# Patient Record
Sex: Male | Born: 1985 | Race: Black or African American | Hispanic: No | Marital: Single | Smoking: Never smoker
Health system: Southern US, Community
[De-identification: ages and names within clinical notes are randomized; demographics above are authoritative.]

---

## 2010-09-21 ENCOUNTER — Emergency Department (HOSPITAL_COMMUNITY)
Admission: EM | Admit: 2010-09-21 | Discharge: 2010-09-21 | Disposition: A | Discharge status: Home / Self Care | Payer: Self-pay | Attending: Emergency Medicine | Admitting: Emergency Medicine

## 2010-09-21 DIAGNOSIS — K089 Disorder of teeth and supporting structures, unspecified: Secondary | ICD-10-CM | POA: Insufficient documentation

## 2010-11-02 ENCOUNTER — Emergency Department (HOSPITAL_COMMUNITY)
Admission: EM | Admit: 2010-11-02 | Discharge: 2010-11-02 | Disposition: A | Discharge status: Home / Self Care | Payer: Self-pay | Attending: Emergency Medicine | Admitting: Emergency Medicine

## 2010-11-02 DIAGNOSIS — K047 Periapical abscess without sinus: Secondary | ICD-10-CM | POA: Insufficient documentation

## 2011-01-18 ENCOUNTER — Emergency Department (HOSPITAL_COMMUNITY)
Admission: EM | Admit: 2011-01-18 | Discharge: 2011-01-18 | Disposition: A | Discharge status: Home / Self Care | Payer: Self-pay | Attending: Emergency Medicine | Admitting: Emergency Medicine

## 2011-01-18 DIAGNOSIS — K089 Disorder of teeth and supporting structures, unspecified: Secondary | ICD-10-CM | POA: Insufficient documentation

## 2011-01-18 DIAGNOSIS — K029 Dental caries, unspecified: Secondary | ICD-10-CM | POA: Insufficient documentation

## 2011-09-06 ENCOUNTER — Emergency Department (HOSPITAL_COMMUNITY)
Admission: EM | Admit: 2011-09-06 | Discharge: 2011-09-06 | Discharge status: Against Medical Advice | Payer: BC Managed Care – PPO | Attending: Emergency Medicine | Admitting: Emergency Medicine

## 2011-09-06 ENCOUNTER — Emergency Department (HOSPITAL_COMMUNITY)
Admission: EM | Admit: 2011-09-06 | Discharge: 2011-09-07 | Disposition: A | Discharge status: Home / Self Care | Payer: BC Managed Care – PPO | Attending: Emergency Medicine | Admitting: Emergency Medicine

## 2011-09-06 ENCOUNTER — Emergency Department (HOSPITAL_COMMUNITY): Payer: BC Managed Care – PPO

## 2011-09-06 ENCOUNTER — Encounter (HOSPITAL_COMMUNITY): Payer: Self-pay | Admitting: *Deleted

## 2011-09-06 DIAGNOSIS — M7989 Other specified soft tissue disorders: Secondary | ICD-10-CM | POA: Insufficient documentation

## 2011-09-06 DIAGNOSIS — T148XXA Other injury of unspecified body region, initial encounter: Secondary | ICD-10-CM

## 2011-09-06 DIAGNOSIS — IMO0002 Reserved for concepts with insufficient information to code with codable children: Secondary | ICD-10-CM | POA: Insufficient documentation

## 2011-09-06 DIAGNOSIS — M79609 Pain in unspecified limb: Secondary | ICD-10-CM | POA: Insufficient documentation

## 2011-09-06 DIAGNOSIS — Y92009 Unspecified place in unspecified non-institutional (private) residence as the place of occurrence of the external cause: Secondary | ICD-10-CM | POA: Insufficient documentation

## 2011-09-06 DIAGNOSIS — S60229A Contusion of unspecified hand, initial encounter: Secondary | ICD-10-CM | POA: Insufficient documentation

## 2011-09-06 NOTE — ED Notes (Signed)
Pt sustained injury to R hand, struck it on a wall while he was moving boxes. Presents w/ pain and swelling.

## 2011-09-07 NOTE — ED Notes (Signed)
Patient is alert and oriented x 4 with respirations even and unlabored.  NAD at this time.  Discharge instructions reviewed with patient and patient verbalized understanding.  Patient ambulated to lobby with steady gait and significant other to transport patient home.

## 2011-09-07 NOTE — Discharge Instructions (Signed)
Contusion A contusion is a deep bruise. Bruises happen when an injury causes bleeding under the skin. Signs of bruising include pain, puffiness (swelling), and discolored skin. The bruise may turn blue, purple, or yellow. HOME CARE   Rest the injured area until the pain and puffiness are better.   Try to limit use of the injured area as much as possible or as told by your doctor.   Put ice on the injured area.   Put ice in a plastic bag.   Place a towel between your skin and the bag.   Leave the ice on for 15 to 20 minutes, 3 to 4 times a day.   Raise (elevate) the injured area above the level of the heart.   Use an elastic bandage to lessen puffiness and motion.   Only take medicine as told by your doctor.   Eat healthy.   See your doctor for a follow-up visit.  GET HELP RIGHT AWAY IF:   There is more redness, puffiness, or pain.   You have a headache, muscle ache, or you feel dizzy and ill.   You have a fever.   The pain is not controlled with medicine.   The bruise is not getting better.   There is yellowish white fluid (pus) coming from the wound.   You lose feeling (numbness) in the injured area.   The bruised area feels cold.   There are new problems.  MAKE SURE YOU:   Understand these instructions.   Will watch your condition.   Will get help right away if you are not doing well or get worse.  Document Released: 12/25/2007 Document Revised: 03/20/2011 Document Reviewed: 12/25/2007 [employer] Patient Information 2012 [employer]. 

## 2011-09-07 NOTE — ED Provider Notes (Signed)
History     CSN: [id number]  Arrival date & time 09/06/11  2336   First MD Initiated Contact with Patient 09/07/11 0124      Chief Complaint  Patient presents with  . Hand Injury    (Consider location/radiation/quality/duration/timing/severity/associated sxs/prior treatment) Patient is a 26 y.o. male presenting with hand injury. The history is provided by the patient.  Hand Injury  The incident occurred 2 days ago. The incident occurred at home (he was moving furniture and his hand was smashed in the door). The injury mechanism was a direct blow and compression. The pain is present in the right hand. The quality of the pain is described as aching and throbbing. The pain is at a severity of 6/10. The pain is moderate. The pain has been constant since the incident. He reports no foreign bodies present. The symptoms are aggravated by movement, use and palpation. He has tried ice for the symptoms. The treatment provided mild relief.    History reviewed. No pertinent past medical history.  History reviewed. No pertinent past surgical history.  History reviewed. No pertinent family history.  History  Substance Use Topics  . Smoking status: Never Smoker   . Smokeless tobacco: Not on file  . Alcohol Use: Yes     occasionally      Review of Systems  All other systems reviewed and are negative.    Allergies  Review of patient's allergies indicates no known allergies.  Home Medications  No current outpatient prescriptions on file.  BP 141/73  Pulse 88  Temp(Src) 99 F (37.2 C) (Oral)  Resp 16  SpO2 99%  Physical Exam  Nursing note and vitals reviewed. Constitutional: He is oriented to person, place, and time. He appears well-developed and well-nourished. No distress.  HENT:  Head: Normocephalic and atraumatic.  Musculoskeletal: He exhibits tenderness. He exhibits no edema.       Right hand: He exhibits tenderness and swelling. He exhibits normal range of motion,  normal capillary refill and no deformity. normal sensation noted. Normal strength noted.       Hands: Neurological: He is alert and oriented to person, place, and time.  Skin: Skin is warm and dry. No erythema.  Psychiatric: He has a normal mood and affect. His behavior is normal.    ED Course  Procedures (including critical care time)  Labs Reviewed - No data to display Dg Hand Complete Right  09/07/2011  *RADIOLOGY REPORT*  Clinical Data: Injury to the right hand.  Pain in the fourth digit and metacarpal bones.  RIGHT HAND - COMPLETE 3+ VIEW  Comparison: None.  Findings: Three views of the right hand were obtained.  Normal alignment of the right hand.  No evidence for acute fracture or dislocation.  No gross soft tissue abnormality.  IMPRESSION: Negative radiographs of the right hand.  Original Report Authenticated By: Richarda Overlie, M.D.     No diagnosis found.    MDM   Patient with hand injury on Thursday afternoon with persistent swelling in his right hand and pain with bending his fingers. He is neurovascularly intact on exam and has full function of all tendons. Plain films negative.        Gwyneth Sprout, MD 09/07/11 [phone]

## 2012-05-25 ENCOUNTER — Emergency Department (HOSPITAL_COMMUNITY): Payer: BC Managed Care – PPO

## 2012-05-25 ENCOUNTER — Emergency Department (HOSPITAL_COMMUNITY)
Admission: EM | Admit: 2012-05-25 | Discharge: 2012-05-25 | Disposition: A | Discharge status: Home / Self Care | Payer: BC Managed Care – PPO | Attending: Emergency Medicine | Admitting: Emergency Medicine

## 2012-05-25 ENCOUNTER — Encounter (HOSPITAL_COMMUNITY): Payer: Self-pay | Admitting: Emergency Medicine

## 2012-05-25 DIAGNOSIS — S6990XA Unspecified injury of unspecified wrist, hand and finger(s), initial encounter: Secondary | ICD-10-CM | POA: Insufficient documentation

## 2012-05-25 DIAGNOSIS — S59909A Unspecified injury of unspecified elbow, initial encounter: Secondary | ICD-10-CM | POA: Insufficient documentation

## 2012-05-25 DIAGNOSIS — X503XXA Overexertion from repetitive movements, initial encounter: Secondary | ICD-10-CM | POA: Insufficient documentation

## 2012-05-25 DIAGNOSIS — Y9389 Activity, other specified: Secondary | ICD-10-CM | POA: Insufficient documentation

## 2012-05-25 DIAGNOSIS — M25539 Pain in unspecified wrist: Secondary | ICD-10-CM

## 2012-05-25 DIAGNOSIS — Y929 Unspecified place or not applicable: Secondary | ICD-10-CM | POA: Insufficient documentation

## 2012-05-25 DIAGNOSIS — M2550 Pain in unspecified joint: Secondary | ICD-10-CM | POA: Insufficient documentation

## 2012-05-25 MED ORDER — IBUPROFEN 600 MG PO TABS: 600.0000 mg | ORAL_TABLET | Freq: Four times a day (QID) | ORAL | Status: DC | PRN

## 2012-05-25 NOTE — ED Provider Notes (Signed)
History     CSN: [id number]  Arrival date & time 05/25/12  4098   First MD Initiated Contact with Patient 05/25/12 2137      Chief Complaint  Patient presents with  . Wrist Pain    (Consider location/radiation/quality/duration/timing/severity/associated sxs/prior treatment) Patient is a 26 y.o. male presenting with wrist pain. The history is provided by the patient and a friend. No language interpreter was used.  Wrist Pain This is a new problem. The current episode started in the past 7 days. The problem occurs daily. Associated symptoms include arthralgias. Pertinent negatives include no fever, nausea, vomiting or weakness. The symptoms are aggravated by bending. He has tried nothing for the symptoms.   26 year old complaining of left wrist pain after he hyperextended it picking up a box 7-8 days ago. Patient has good range of motion and 2+ radial pulse to the left wrist. No acute distress. He has taken nothing for pain.   History reviewed. No pertinent past medical history.  History reviewed. No pertinent past surgical history.  History reviewed. No pertinent family history.  History  Substance Use Topics  . Smoking status: Never Smoker   . Smokeless tobacco: Not on file  . Alcohol Use: Yes     Comment: occasionally      Review of Systems  Constitutional: Negative.  Negative for fever.  HENT: Negative.   Eyes: Negative.   Respiratory: Negative.   Cardiovascular: Negative.   Gastrointestinal: Negative.  Negative for nausea and vomiting.  Musculoskeletal: Positive for arthralgias.       Left wrist pain  Neurological: Negative.  Negative for weakness.  Psychiatric/Behavioral: Negative.   All other systems reviewed and are negative.    Allergies  Review of patient's allergies indicates no known allergies.  Home Medications  No current outpatient prescriptions on file.  BP 156/90  Pulse 77  Temp 98.9 F (37.2 C) (Oral)  Resp 16  SpO2 98%  Physical Exam    Nursing note and vitals reviewed. Constitutional: He is oriented to person, place, and time. He appears well-developed and well-nourished.  HENT:  Head: Normocephalic.  Eyes: Conjunctivae normal and EOM are normal. Pupils are equal, round, and reactive to light.  Neck: Normal range of motion. Neck supple.  Cardiovascular: Normal rate.   Pulmonary/Chest: Effort normal.  Abdominal: Soft.  Musculoskeletal: Normal range of motion. He exhibits tenderness.       Left wrist tenderness with hyperextension. Good range of motion 2+ radial pulse no acute distress.  Neurological: He is alert and oriented to person, place, and time.  Skin: Skin is warm and dry.  Psychiatric: He has a normal mood and affect.    ED Course  Procedures (including critical care time)  Labs Reviewed - No data to display Dg Wrist Complete Left  05/25/2012  *RADIOLOGY REPORT*  Clinical Data: Bent wrist backwards while lifting heavy box 1 week ago; left wrist pain, primarily at the anterior left wrist.  LEFT WRIST - COMPLETE 3+ VIEW  Comparison: None.  Findings: There is no evidence of fracture or dislocation.  The carpal rows are intact, and demonstrate normal alignment.  The joint spaces are preserved.  No significant soft tissue abnormalities are seen.  IMPRESSION: No evidence of fracture or dislocation.   Original Report Authenticated By: Tonia Ghent, M.D.      No diagnosis found.    MDM  Left wrist pain after hyperextending last week cutting the box. Wrist splint provided. Positive CMS below injury. Will followup with PCP  of choice. Take ibuprofen for pain every 6 hours and ice prn. Wrist x-ray was unremarkable and reviewed by myself.        Remi Haggard, NP 05/25/12 2224

## 2012-05-25 NOTE — ED Notes (Signed)
Pt states that he caught a box last week and hyperextended his L wrist. Pain went away but has now come back. No obvious deformity. Feels popping.

## 2012-05-25 NOTE — ED Provider Notes (Signed)
Medical screening examination/treatment/procedure(s) were performed by non-physician practitioner and as supervising physician I was immediately available for consultation/collaboration.   Gwyneth Sprout, MD 05/25/12 [phone]

## 2013-02-16 ENCOUNTER — Emergency Department (HOSPITAL_COMMUNITY)
Admission: EM | Admit: 2013-02-16 | Discharge: 2013-02-16 | Disposition: A | Discharge status: Home / Self Care | Payer: Self-pay | Attending: Emergency Medicine | Admitting: Emergency Medicine

## 2013-02-16 ENCOUNTER — Encounter (HOSPITAL_COMMUNITY): Payer: Self-pay | Admitting: Emergency Medicine

## 2013-02-16 ENCOUNTER — Emergency Department (HOSPITAL_COMMUNITY): Payer: Self-pay

## 2013-02-16 DIAGNOSIS — S6992XA Unspecified injury of left wrist, hand and finger(s), initial encounter: Secondary | ICD-10-CM

## 2013-02-16 DIAGNOSIS — Y990 Civilian activity done for income or pay: Secondary | ICD-10-CM | POA: Insufficient documentation

## 2013-02-16 DIAGNOSIS — S6990XA Unspecified injury of unspecified wrist, hand and finger(s), initial encounter: Secondary | ICD-10-CM | POA: Insufficient documentation

## 2013-02-16 DIAGNOSIS — W230XXA Caught, crushed, jammed, or pinched between moving objects, initial encounter: Secondary | ICD-10-CM | POA: Insufficient documentation

## 2013-02-16 DIAGNOSIS — S6980XA Other specified injuries of unspecified wrist, hand and finger(s), initial encounter: Secondary | ICD-10-CM | POA: Insufficient documentation

## 2013-02-16 DIAGNOSIS — Y939 Activity, unspecified: Secondary | ICD-10-CM | POA: Insufficient documentation

## 2013-02-16 DIAGNOSIS — Y929 Unspecified place or not applicable: Secondary | ICD-10-CM | POA: Insufficient documentation

## 2013-02-16 DIAGNOSIS — R209 Unspecified disturbances of skin sensation: Secondary | ICD-10-CM | POA: Insufficient documentation

## 2013-02-16 NOTE — ED Provider Notes (Signed)
CSN: [id number]     Arrival date & time 02/16/13  2143 History    This chart was scribed for Daniel Washington, non-physician practitioner working with Dione Booze, MD by Leone Payor, ED Scribe. This patient was seen in room [facility] and the patient's care was started at 2143.   First MD Initiated Contact with Patient 02/16/13 2245     Chief Complaint  Patient presents with  . Finger Injury    The history is provided by the patient. No language interpreter was used.    HPI Comments: Daniel Washington is a 27 y.o. male who presents to the Emergency Department complaining of a right middle finger injury that occurred 4 days ago. Pt states the tip of the finger was smashed under a metal piece on a conveyor belt at [employer]. He has associated numbness at the tip of the right middle finger and he noticed a clicking noise when he moves the joint.   History reviewed. No pertinent past medical history. History reviewed. No pertinent past surgical history. No family history on file. History  Substance Use Topics  . Smoking status: Never Smoker   . Smokeless tobacco: Not on file  . Alcohol Use: Yes     Comment: occasionally    Review of Systems  Musculoskeletal: Positive for arthralgias (right finger pain).  Neurological: Positive for numbness.  All other systems reviewed and are negative.    Allergies  Review of patient's allergies indicates no known allergies.  Home Medications  No current outpatient prescriptions on file. BP 148/87  Pulse 69  Temp(Src) 98.5 F (36.9 C) (Oral)  Resp 14  SpO2 96% Physical Exam  Nursing note and vitals reviewed. Constitutional: He appears well-developed and well-nourished. No distress.  HENT:  Head: Normocephalic and atraumatic.  Eyes: Pupils are equal, round, and reactive to light.  Neck: Normal range of motion. Neck supple.  Cardiovascular: Normal rate and regular rhythm.   Pulmonary/Chest: Effort normal.  Abdominal: Soft.   Musculoskeletal:       Left hand: He exhibits disruption of two-point discrimination. He exhibits normal range of motion, no tenderness, no bony tenderness, normal capillary refill, no deformity, no laceration and no swelling. Decreased sensation (finger tip does not feel dull sensations.) noted. Normal strength noted.       Hands: Neurological: He is alert.  Skin: Skin is warm and dry.    ED Course   Procedures (including critical care time)  DIAGNOSTIC STUDIES: Oxygen Saturation is 96% on RA, adequate by my interpretation.    COORDINATION OF CARE: 11:21 PM Discussed treatment plan with pt at bedside and pt agreed to plan.   Labs Reviewed - No data to display Dg Finger Middle Right  02/16/2013   *RADIOLOGY REPORT*  Clinical Data: Injury to right third finger.  RIGHT MIDDLE FINGER 2+V  Comparison: None.  Findings: There is no evidence of acute fracture, dislocation or soft tissue abnormality.  IMPRESSION: Normal right second finger.   Original Report Authenticated By: Irish Lack, M.D.   1. Finger injury, left, initial encounter     MDM  Finger splint applied Referral to Hand specialist  26 y.o.[name]'s evaluation in the Emergency Department is complete. It has been determined that no acute conditions requiring further emergency intervention are present at this time. The patient/guardian have been advised of the diagnosis and plan. We have discussed signs and symptoms that warrant return to the ED, such as changes or worsening in symptoms.  Vital signs are  stable at discharge. Filed Vitals:   02/16/13 2149  BP: 148/87  Pulse: 69  Temp: 98.5 F (36.9 C)  Resp: 14    Patient/guardian has voiced understanding and agreed to follow-up with the PCP or specialist.  I personally performed the services described in this documentation, which was scribed in my presence. The recorded information has been reviewed and is accurate.    Daniel Matas, PA-C 02/16/13  2327

## 2013-02-16 NOTE — ED Provider Notes (Signed)
Medical screening examination/treatment/procedure(s) were performed by non-physician practitioner and as supervising physician I was immediately available for consultation/collaboration.   Genessis Flanary, MD 02/16/13 2341 

## 2013-02-16 NOTE — ED Notes (Signed)
PT. REPORTS INJURY TO RIGHT DISTAL MIDDLE FINGER AT WORK ( [employer]) LAST Friday , PT. STATED NUMBNESS AT TIP OF RIGHT MIDDLE FINGER .

## 2013-02-16 NOTE — ED Notes (Signed)
Pt to room after xray

## 2014-08-03 ENCOUNTER — Encounter (HOSPITAL_COMMUNITY): Payer: Self-pay | Admitting: Emergency Medicine

## 2014-08-03 ENCOUNTER — Emergency Department (HOSPITAL_COMMUNITY)
Admission: EM | Admit: 2014-08-03 | Discharge: 2014-08-03 | Disposition: A | Discharge status: Home / Self Care | Payer: BLUE CROSS/BLUE SHIELD | Attending: Emergency Medicine | Admitting: Emergency Medicine

## 2014-08-03 DIAGNOSIS — K0889 Other specified disorders of teeth and supporting structures: Secondary | ICD-10-CM

## 2014-08-03 DIAGNOSIS — J029 Acute pharyngitis, unspecified: Secondary | ICD-10-CM | POA: Insufficient documentation

## 2014-08-03 DIAGNOSIS — K002 Abnormalities of size and form of teeth: Secondary | ICD-10-CM | POA: Insufficient documentation

## 2014-08-03 DIAGNOSIS — K0381 Cracked tooth: Secondary | ICD-10-CM | POA: Insufficient documentation

## 2014-08-03 DIAGNOSIS — K088 Other specified disorders of teeth and supporting structures: Secondary | ICD-10-CM | POA: Diagnosis present

## 2014-08-03 DIAGNOSIS — K029 Dental caries, unspecified: Secondary | ICD-10-CM | POA: Diagnosis not present

## 2014-08-03 LAB — RAPID STREP SCREEN (MED CTR MEBANE ONLY): STREPTOCOCCUS, GROUP A SCREEN (DIRECT): NEGATIVE

## 2014-08-03 MED ORDER — TRAMADOL HCL 50 MG PO TABS: 50.0000 mg | ORAL_TABLET | Freq: Four times a day (QID) | ORAL | Status: DC | PRN

## 2014-08-03 MED ORDER — PENICILLIN V POTASSIUM 500 MG PO TABS: 500.0000 mg | ORAL_TABLET | Freq: Four times a day (QID) | ORAL | Status: AC

## 2014-08-03 NOTE — ED Provider Notes (Signed)
CSN: [id number]     Arrival date & time 08/03/14  2044 History  This chart was scribed for non-physician practitioner, Antony Madura, PA-C,working with Mirian Mo, MD, by Karle Plumber, ED Scribe. This patient was seen in room WTR7/[facility] and the patient's care was started at 11:05 PM.  Chief Complaint  Patient presents with  . Sore Throat  . Dental Pain   Patient is a 29 y.o. male presenting with pharyngitis and tooth pain. The history is provided by the patient. No language interpreter was used.  Sore Throat  Dental Pain Associated symptoms: no congestion, no drooling and no fever     HPI Comments:  Daniel Washington is a 29 y.o. male who presents to the Emergency Department complaining of intermittent severe, aching, nonradiating right lower dental pain that began approximately two weeks ago. He reports associated constant sore throat that began approximately one week ago. He states he has taken Thera Flu and Tylenol with no relief of the pain. Chewing makes the tooth pain worse. Swallowing food makes the throat pain worse. Swallowing warm liquids helps alleviate the pain. Denies nasal congestion, rhinorrhea, fever, chills, drooling, inability to swallow, neck stiffness, nausea or vomiting. Pt has dentist appt in one week.  History reviewed. No pertinent past medical history. History reviewed. No pertinent past surgical history. History reviewed. No pertinent family history. History  Substance Use Topics  . Smoking status: Never Smoker   . Smokeless tobacco: Not on file  . Alcohol Use: No    Review of Systems  Constitutional: Negative for fever and chills.  HENT: Positive for dental problem and sore throat. Negative for congestion, drooling, rhinorrhea and trouble swallowing.   Gastrointestinal: Negative for nausea and vomiting.  Musculoskeletal: Negative for neck stiffness.  All other systems reviewed and are negative.   Allergies  Review of patient's allergies indicates no  known allergies.  Home Medications   Prior to Admission medications   Medication Sig Start Date End Date Taking? Authorizing Provider  penicillin v potassium (VEETID) 500 MG tablet Take 1 tablet (500 mg total) by mouth 4 (four) times daily. 08/03/14 08/10/14  Antony Madura, PA-C  traMADol (ULTRAM) 50 MG tablet Take 1 tablet (50 mg total) by mouth every 6 (six) hours as needed for severe pain. 08/03/14   Antony Madura, PA-C   Triage Vitals: BP 143/92 mmHg  Pulse 89  Temp(Src) 98.1 F (36.7 C) (Oral)  Resp 18  Ht  (1.676 m)  Wt 190 lb (86.183 kg)  BMI 30.68 kg/m2  SpO2 98%  Physical Exam  Constitutional: He is oriented to person, place, and time. He appears well-developed and well-nourished. No distress.  Nontoxic/nonseptic appearing  HENT:  Head: Normocephalic and atraumatic.  Right Ear: Tympanic membrane, external ear and ear canal normal. No mastoid tenderness.  Left Ear: Tympanic membrane, external ear and ear canal normal. No mastoid tenderness.  Nose: Nose normal.  Mouth/Throat: Uvula is midline and oropharynx is clear and moist. No oral lesions. No trismus in the jaw. Abnormal dentition. Dental caries present. No dental abscesses or uvula swelling.    Uvula midline. Patient tolerating secretions without difficulty. Patient has a cracked right lower second molar. No gingival swelling or fluctuance. No purulence or oral bleeding. No trismus. No posterior oropharyngeal erythema or exudates.  Eyes: Conjunctivae and EOM are normal. No scleral icterus.  Neck: Normal range of motion.  No nuchal rigidity or meningismus  Cardiovascular: Normal rate.   Pulmonary/Chest: Effort normal. No respiratory distress. He has  no wheezes.  Respirations even and unlabored  Musculoskeletal: Normal range of motion.  Neurological: He is alert and oriented to person, place, and time. He exhibits normal muscle tone. Coordination normal.  Skin: Skin is warm and dry. No rash noted. He is not diaphoretic.  No erythema. No pallor.  Psychiatric: He has a normal mood and affect. His behavior is normal.  Nursing note and vitals reviewed.   ED Course  Procedures (including critical care time) DIAGNOSTIC STUDIES: Oxygen Saturation is 98% on RA, normal by my interpretation.   COORDINATION OF CARE: 11:13 PM- Will prescribe antibiotic and pain medication and encouraged pt to keep dental appt next week. Pt verbalizes understanding and agrees to plan.  Medications - No data to display  Labs Review Labs Reviewed  RAPID STREP SCREEN  CULTURE, GROUP A STREP    Imaging Review No results found.   EKG Interpretation None      MDM   Final diagnoses:  Dentalgia  Pharyngitis    Patient with toothache. No gross abscess. Exam unconcerning for Ludwig's angina or spread of infection. Will treat with penicillin and pain medicine. Urged patient to follow-up with dentist. He states he has a dental visit scheduled for 1 week from today. Patient also complaining about sore throat x1 week. As patient is being started on penicillin for dentalgia, this will cover for strep; however, his symptoms today and physical exam are not concerning for strep pharyngitis. Uvula midline. Patient tolerating secretions without difficulty. No stridor or respiratory compromise. Saltwater gargles advised and return precautions provided. Patient agreeable to plan with no unaddressed concerns.  I personally performed the services described in this documentation, which was scribed in my presence. The recorded information has been reviewed and is accurate.   Filed Vitals:   08/03/14 2058 08/03/14 2321  BP: 143/92 146/94  Pulse: 89 89  Temp: 98.1 F (36.7 C)   TempSrc: Oral   Resp: 18   Height: 5\' 6"  (1.676 m)   Weight: 190 lb (86.183 kg)   SpO2: 98% 99%     Antony MaduraKelly Geni Skorupski, PA-C 08/04/14 0024  Mirian MoMatthew Gentry, MD 08/05/14 0145

## 2014-08-03 NOTE — ED Notes (Signed)
Pt is c/o severe sore throat  States it started about 3 weeks ago  Pt is also c/o toothache right bottom that started about a month and a half ago

## 2014-08-03 NOTE — Discharge Instructions (Signed)
Dental Pain A tooth ache may be caused by cavities (tooth decay). Cavities expose the nerve of the tooth to air and hot or cold temperatures. It may come from an infection or abscess (also called a boil or furuncle) around your tooth. It is also often caused by dental caries (tooth decay). This causes the pain you are having. DIAGNOSIS  Your caregiver can diagnose this problem by exam. TREATMENT   If caused by an infection, it may be treated with medications which kill germs (antibiotics) and pain medications as prescribed by your caregiver. Take medications as directed.  Only take over-the-counter or prescription medicines for pain, discomfort, or fever as directed by your caregiver.  Whether the tooth ache today is caused by infection or dental disease, you should see your dentist as soon as possible for further care. SEEK MEDICAL CARE IF: The exam and treatment you received today has been provided on an emergency basis only. This is not a substitute for complete medical or dental care. If your problem worsens or new problems (symptoms) appear, and you are unable to meet with your dentist, call or return to this location. SEEK IMMEDIATE MEDICAL CARE IF:   You have a fever.  You develop redness and swelling of your face, jaw, or neck.  You are unable to open your mouth.  You have severe pain uncontrolled by pain medicine. MAKE SURE YOU:   Understand these instructions.  Will watch your condition.  Will get help right away if you are not doing well or get worse. Document Released: 07/08/2005 Document Revised: 09/30/2011 Document Reviewed: 02/24/2008 [facility] Patient Information 2015 [place]. This information is not intended to replace advice given to you by your health care provider. Make sure you discuss any questions you have with your health care provider. Pharyngitis Pharyngitis is redness, pain, and swelling (inflammation) of your pharynx.  CAUSES  Pharyngitis is  usually caused by infection. Most of the time, these infections are from viruses (viral) and are part of a cold. However, sometimes pharyngitis is caused by bacteria (bacterial). Pharyngitis can also be caused by allergies. Viral pharyngitis may be spread from person to person by coughing, sneezing, and personal items or utensils (cups, forks, spoons, toothbrushes). Bacterial pharyngitis may be spread from person to person by more intimate contact, such as kissing.  SIGNS AND SYMPTOMS  Symptoms of pharyngitis include:   Sore throat.   Tiredness (fatigue).   Low-grade fever.   Headache.  Joint pain and muscle aches.  Skin rashes.  Swollen lymph nodes.  Plaque-like film on throat or tonsils (often seen with bacterial pharyngitis). DIAGNOSIS  Your health care provider will ask you questions about your illness and your symptoms. Your medical history, along with a physical exam, is often all that is needed to diagnose pharyngitis. Sometimes, a rapid strep test is done. Other lab tests may also be done, depending on the suspected cause.  TREATMENT  Viral pharyngitis will usually get better in 3-4 days without the use of medicine. Bacterial pharyngitis is treated with medicines that kill germs (antibiotics).  HOME CARE INSTRUCTIONS   Drink enough water and fluids to keep your urine clear or pale yellow.   Only take over-the-counter or prescription medicines as directed by your health care provider:   If you are prescribed antibiotics, make sure you finish them even if you start to feel better.   Do not take aspirin.   Get lots of rest.   Gargle with 8 oz of salt water (  tsp of salt per 1 qt of water) as often as every 1-2 hours to soothe your throat.   Throat lozenges (if you are not at risk for choking) or sprays may be used to soothe your throat. SEEK MEDICAL CARE IF:   You have large, tender lumps in your neck.  You have a rash.  You cough up green, yellow-brown, or  bloody spit. SEEK IMMEDIATE MEDICAL CARE IF:   Your neck becomes stiff.  You drool or are unable to swallow liquids.  You vomit or are unable to keep medicines or liquids down.  You have severe pain that does not go away with the use of recommended medicines.  You have trouble breathing (not caused by a stuffy nose). MAKE SURE YOU:   Understand these instructions.  Will watch your condition.  Will get help right away if you are not doing well or get worse. Document Released: 07/08/2005 Document Revised: 04/28/2013 Document Reviewed: 03/15/2013 [facility] Patient Information 2015 [place], [place]. This information is not intended to replace advice given to you by your health care provider. Make sure you discuss any questions you have with your health care provider. Salt Water Gargle This solution will help make your mouth and throat feel better. HOME CARE INSTRUCTIONS   Mix 1 teaspoon of salt in 8 ounces of warm water.  Gargle with this solution as much or often as you need or as directed. Swish and gargle gently if you have any sores or wounds in your mouth.  Do not swallow this mixture. Document Released: 04/11/2004 Document Revised: 09/30/2011 Document Reviewed: 09/02/2008 [facility] Patient Information 2015 [place], [place]. This information is not intended to replace advice given to you by your health care provider. Make sure you discuss any questions you have with your health care provider.

## 2014-08-06 LAB — CULTURE, GROUP A STREP

## 2014-08-29 ENCOUNTER — Emergency Department (HOSPITAL_COMMUNITY)
Admission: EM | Admit: 2014-08-29 | Discharge: 2014-08-29 | Disposition: A | Discharge status: Home / Self Care | Payer: BLUE CROSS/BLUE SHIELD | Attending: Emergency Medicine | Admitting: Emergency Medicine

## 2014-08-29 ENCOUNTER — Encounter (HOSPITAL_COMMUNITY): Payer: Self-pay | Admitting: Emergency Medicine

## 2014-08-29 DIAGNOSIS — S0181XA Laceration without foreign body of other part of head, initial encounter: Secondary | ICD-10-CM

## 2014-08-29 DIAGNOSIS — S01411A Laceration without foreign body of right cheek and temporomandibular area, initial encounter: Secondary | ICD-10-CM | POA: Insufficient documentation

## 2014-08-29 DIAGNOSIS — Y9389 Activity, other specified: Secondary | ICD-10-CM | POA: Diagnosis not present

## 2014-08-29 DIAGNOSIS — Y92410 Unspecified street and highway as the place of occurrence of the external cause: Secondary | ICD-10-CM | POA: Diagnosis not present

## 2014-08-29 DIAGNOSIS — Y998 Other external cause status: Secondary | ICD-10-CM | POA: Insufficient documentation

## 2014-08-29 MED ORDER — TETANUS-DIPHTH-ACELL PERTUSSIS 5-2.5-18.5 LF-MCG/0.5 IM SUSP
0.5000 mL | Freq: Once | INTRAMUSCULAR | Status: AC
  Administered 2014-08-29: 0.5 mL via INTRAMUSCULAR
  Filled 2014-08-29: qty 0.5

## 2014-08-29 MED ORDER — LIDOCAINE HCL (PF) 1 % IJ SOLN
5.0000 mL | Freq: Once | INTRAMUSCULAR | Status: AC
  Administered 2014-08-29: 5 mL via INTRADERMAL
  Filled 2014-08-29: qty 5

## 2014-08-29 MED ORDER — LIDOCAINE-EPINEPHRINE 1 %-1:100000 IJ SOLN
10.0000 mL | Freq: Once | INTRAMUSCULAR | Status: AC
  Administered 2014-08-29: 10 mL
  Filled 2014-08-29: qty 1
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
## 2014-08-29 NOTE — Discharge Instructions (Signed)
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
## 2019-01-13 ENCOUNTER — Emergency Department (HOSPITAL_COMMUNITY)
Admission: EM | Admit: 2019-01-13 | Discharge: 2019-01-13 | Discharge status: Absent without leave | Payer: BLUE CROSS/BLUE SHIELD

## 2019-04-22 ENCOUNTER — Other Ambulatory Visit: Payer: Self-pay

## 2019-04-22 DIAGNOSIS — W208XXA Other cause of strike by thrown, projected or falling object, initial encounter: Secondary | ICD-10-CM | POA: Insufficient documentation

## 2019-04-22 DIAGNOSIS — Y939 Activity, unspecified: Secondary | ICD-10-CM | POA: Diagnosis not present

## 2019-04-22 DIAGNOSIS — S99912A Unspecified injury of left ankle, initial encounter: Secondary | ICD-10-CM | POA: Diagnosis present

## 2019-04-22 DIAGNOSIS — S8262XA Displaced fracture of lateral malleolus of left fibula, initial encounter for closed fracture: Secondary | ICD-10-CM | POA: Diagnosis not present

## 2019-04-22 DIAGNOSIS — Y929 Unspecified place or not applicable: Secondary | ICD-10-CM | POA: Insufficient documentation

## 2019-04-22 DIAGNOSIS — Y990 Civilian activity done for income or pay: Secondary | ICD-10-CM | POA: Diagnosis not present

## 2019-04-22 NOTE — ED Triage Notes (Signed)
Pt c/o L leg injury due to pallets falling onto his leg while at work.

## 2019-04-23 ENCOUNTER — Other Ambulatory Visit: Payer: Self-pay

## 2019-04-23 ENCOUNTER — Encounter (HOSPITAL_BASED_OUTPATIENT_CLINIC_OR_DEPARTMENT_OTHER): Payer: Self-pay

## 2019-04-23 ENCOUNTER — Emergency Department (HOSPITAL_BASED_OUTPATIENT_CLINIC_OR_DEPARTMENT_OTHER)
Admission: EM | Admit: 2019-04-23 | Discharge: 2019-04-23 | Disposition: A | Discharge status: Home / Self Care | Payer: Worker's Compensation | Attending: Emergency Medicine | Admitting: Emergency Medicine

## 2019-04-23 ENCOUNTER — Emergency Department (HOSPITAL_BASED_OUTPATIENT_CLINIC_OR_DEPARTMENT_OTHER): Payer: BLUE CROSS/BLUE SHIELD | Attending: Emergency Medicine

## 2019-04-23 DIAGNOSIS — S82392A Other fracture of lower end of left tibia, initial encounter for closed fracture: Secondary | ICD-10-CM

## 2019-04-23 MED ORDER — OXYCODONE-ACETAMINOPHEN 5-325 MG PO TABS: 1.0000 | ORAL_TABLET | Freq: Four times a day (QID) | ORAL | 0 refills | Status: DC | PRN

## 2019-04-23 MED ORDER — OXYCODONE-ACETAMINOPHEN 5-325 MG PO TABS
1.0000 | ORAL_TABLET | ORAL | Status: AC | PRN
  Administered 2019-04-23 (×2): 1 via ORAL
  Filled 2019-04-23 (×2): qty 1

## 2019-04-23 NOTE — ED Provider Notes (Signed)
[facility] Provider Note: Georgena Spurling, MD, FACEP  CSN: [id number] MRN: [id number] ARRIVAL: 04/22/19 at [facility]: [place]  Leg Injury   HISTORY OF PRESENT ILLNESS  04/23/19 1:40 AM Daniel Washington is a 33 y.o. male who had several pallets fall onto his left leg at work just prior to arrival.  He is having pain in his left ankle radiating occipitally.  He rates the pain as a 10 out of 10, worse with movement or palpation.  He is unable to bear weight on the left foot.  He denies other injury.   History reviewed. No pertinent past medical history.  History reviewed. No pertinent surgical history.  No family history on file.  Social History   Tobacco Use   Smoking status: Never Smoker  Substance Use Topics   Alcohol use: Yes    Comment: occ   Drug use: No    Prior to Admission medications   Medication Sig Start Date End Date Taking? Authorizing Provider  traMADol (ULTRAM) 50 MG tablet Take 1 tablet (50 mg total) by mouth every 6 (six) hours as needed for severe pain. 08/03/14   Antonietta Breach, PA-C    Allergies Patient has no known allergies.   REVIEW OF SYSTEMS  Negative except as noted here or in the History of Present Illness.   PHYSICAL EXAMINATION  Initial Vital Signs Blood pressure (!) 138/98, pulse 99, temperature 99.5 F (37.5 C), temperature source Oral, resp. rate 20, height 5\' 6"  (1.676 m), weight 104.3 kg, SpO2 98 %.  Examination General: Well-developed, well-nourished male in no acute distress; appearance consistent with age of record HENT: normocephalic; atraumatic Eyes: Normal appearance Neck: supple Heart: regular rate and rhythm Lungs: clear to auscultation bilaterally Abdomen: soft; nondistended; nontender; bowel sounds present Extremities: No deformity; no tenderness, swelling, ecchymosis or instability of left knee; left calf compartments soft and nontender; tenderness of left ankle with decreased range  of motion, left foot distally neurovascularly intact with intact tendon function Neurologic: Awake, alert and oriented; motor function intact in all extremities and symmetric; no facial droop Skin: Warm and dry Psychiatric: Normal mood and affect   RESULTS  Summary of this visit's results, reviewed by myself:   EKG Interpretation  Date/Time:    Ventricular Rate:    PR Interval:    QRS Duration:   QT Interval:    QTC Calculation:   R Axis:     Text Interpretation:        Laboratory Studies: No results found for this or any previous visit (from the past 24 hour(s)). Imaging Studies: Dg Tibia/fibula Left  Result Date: 04/23/2019 CLINICAL DATA:  Pallet fell on left leg while at work EXAM: LEFT TIBIA AND FIBULA - 2 VIEW; LEFT ANKLE COMPLETE - 3+ VIEW COMPARISON:  Same day knee radiographs FINDINGS: There is a minimally displaced posterior malleolar fracture involving the distal tibia with intra-articular extension. Circumferential ankle swelling and a moderate left ankle effusion is present. No additional fracture or traumatic malalignment is seen. The ankle mortise remains congruent. Corticated os peroneum is noted. Bidirectional calcaneal spurs are present. Knee radiographs evaluate the proximal tibia and fibula. No proximal tibia or fibular fractures are identified. IMPRESSION: 1. Minimally displaced posterior malleolar fracture with intra-articular extension. 2. Moderate left ankle effusion and soft tissue swelling. Electronically Signed   By: Lovena Le M.D.   On: 04/23/2019 00:55   Dg Ankle Complete Left  Result Date: 04/23/2019 CLINICAL DATA:  Pallet  fell on left leg while at work EXAM: LEFT TIBIA AND FIBULA - 2 VIEW; LEFT ANKLE COMPLETE - 3+ VIEW COMPARISON:  Same day knee radiographs FINDINGS: There is a minimally displaced posterior malleolar fracture involving the distal tibia with intra-articular extension. Circumferential ankle swelling and a moderate left ankle effusion is  present. No additional fracture or traumatic malalignment is seen. The ankle mortise remains congruent. Corticated os peroneum is noted. Bidirectional calcaneal spurs are present. Knee radiographs evaluate the proximal tibia and fibula. No proximal tibia or fibular fractures are identified. IMPRESSION: 1. Minimally displaced posterior malleolar fracture with intra-articular extension. 2. Moderate left ankle effusion and soft tissue swelling. Electronically Signed   By: Kreg Shropshire M.D.   On: 04/23/2019 00:55   Dg Knee Complete 4 Views Left  Result Date: 04/23/2019 CLINICAL DATA:  Pallet fell on leg, while at work pain radiating from the ankle EXAM: LEFT KNEE - COMPLETE 4+ VIEW COMPARISON:  None. FINDINGS: Small suprapatellar effusion. Minimal swelling. No acute fracture or traumatic malalignment. Normal bone mineralization. No suspicious osseous lesions. No significant arthrosis or joint space narrowing. IMPRESSION: Small knee effusion and mild swelling. No acute osseous abnormality. Electronically Signed   By: Kreg Shropshire M.D.   On: 04/23/2019 00:52    ED COURSE and MDM  Nursing notes and initial vitals signs, including pulse oximetry, reviewed.  Vitals:   04/23/19 0006 04/23/19 0008  BP: (!) 138/98   Pulse: 99   Resp: 20   Temp: 99.5 F (37.5 C)   TempSrc: Oral   SpO2: 98%   Weight:  104.3 kg  Height:  5\' 6"  (1.676 m)   We will place the patient's left leg in a short posterior splint and provide crutches keeping him nonweightbearing.  He was advised the fracture may require surgical fixation.  Although swelling and an effusion are seen on the knee radiograph his knee on examination is unremarkable.  PROCEDURES    ED DIAGNOSES     ICD-10-CM   1. Fracture, posterior malleolus, left, closed, initial encounter  S82.392A        Lakyla Biswas, MD 04/23/19 0200

## 2019-05-13 ENCOUNTER — Other Ambulatory Visit: Payer: Self-pay | Admitting: Orthopaedic Surgery

## 2019-05-20 NOTE — Progress Notes (Signed)
Patient denies shortness of breath, fever, cough and chest pain.  PCP - Denies    Cardiologist - Denies  Chest x-ray - Denies EKG - Denies Stress Test - Denies ECHO - Denies Cardiac Cath - Denies  ERAS: Clears til 7:30 am DOS, Ensure drink given.  Anesthesia review: no  STOP now taking any Aspirin (unless otherwise instructed by your surgeon), Aleve, Naproxen, Ibuprofen, Motrin, Advil, Goody's, BC's, all herbal medications, fish oil, and all vitamins.   Coronavirus Screening Have you experienced the following symptoms:  Cough yes/no: No Fever (>100.64F)  yes/no: No Runny nose yes/no: No Sore throat yes/no: No Difficulty breathing/shortness of breath  yes/no: No  Have you traveled in the last 14 days and where? yes/no: No  Patient verbalized understanding of instructions that were given to them at the PAT appointment.

## 2019-05-20 NOTE — Progress Notes (Signed)
[id number], [place] - [place] [address] Phone: [phone] Fax: [id number] - [place], [place] - [address] AT [place] [address] [place] [address] Phone: [phone] Fax: [phone]      Your procedure is scheduled on Tuesday, 11/3.  Report to [facility]tr[facility] "A" at 8:30 am A.M., and check in at the Admitting office.  Call this number if you have problems the morning of surgery:  [phone]  Call [phone] if you have any questions prior to your surgery date Monday-Friday 8am-4pm    Remember:  Do not eat after midnight the night before your surgery -Monday  You may drink clear liquids until 7:30 am the morning of your surgery.    Clear liquids allowed are: Water, Non-Citrus Juices (without pulp), Carbonated Beverages, Clear Tea, Black Coffee Only, and Gatorade   Please complete your PRE-SURGERY ENSURE that was provided to you by 7:30 am,  the morning of surgery.  Please, if able, drink it in one setting. DO NOT SIP.   Take these medicines the morning of surgery with A SIP OF WATER: oxyCODONE-acetaminophen (PERCOCET)  If needed   STOP now taking any Aspirin (unless otherwise instructed by your surgeon), Aleve, Naproxen, Ibuprofen, Motrin, Advil, Goody's, BC's, all herbal medications, fish oil, and all vitamins.    The Morning of Surgery  Do not wear jewelry.  Do not wear lotions,powders, colognes or deodorant  Do not shave 48 hours prior to surgery.  Men may shave face and neck.  Do not bring valuables to the hospital.  [facility] is not responsible for any belongings or valuables.  If you are a smoker, DO NOT Smoke 24 hours prior to surgery  IF you wear a CPAP at night please bring your mask, tubing, and machine the morning of surgery   Remember that you must have someone to transport you home after  your surgery, and remain with you for 24 hours if you are discharged the same day.  Patients discharged the day of surgery will not be allowed to drive home.    Contacts, glasses, hearing aids, dentures or bridgework may not be worn into surgery.   Leave your suitcase in the car.  After surgery it may be brought to your room.  For patients admitted to the hospital, discharge time will be determined by your treatment team.   Special instructions:   [facility]- Preparing For Surgery  Before surgery, you can play an important role. Because skin is not sterile, your skin needs to be as free of germs as possible. You can reduce the number of germs on your skin by washing with CHG (chlorahexidine gluconate) Soap before surgery.  CHG is an antiseptic cleaner which kills germs and bonds with the skin to continue killing germs even after washing.    Oral Hygiene is also important to reduce your risk of infection.  Remember - BRUSH YOUR TEETH THE MORNING OF SURGERY WITH YOUR REGULAR TOOTHPASTE  Please do not use if you have an allergy to CHG or antibacterial soaps. If your skin becomes reddened/irritated stop using the CHG.  Do not shave (including legs and underarms) for at least 48 hours prior to first CHG shower. It is OK to shave your face.  Please follow these instructions carefully.   1. Shower the [facility] the [facility]  OF SURGERY Tues with CHG Soap.   2. If you chose to wash your hair, wash your hair first as usual with your normal shampoo.  3. After you shampoo, rinse your hair and body thoroughly to remove the shampoo.  4. Use CHG as you would any other liquid soap. You can apply CHG directly to the skin and wash gently with a scrungie or a clean washcloth.   5. Apply the CHG Soap to your body ONLY FROM THE NECK DOWN.  Do not use on open wounds or open sores. Avoid contact with your eyes, ears, mouth and genitals (private parts). Wash Face and genitals (private parts)   with your normal soap.   6. Wash thoroughly, paying special attention to the area where your surgery will be performed.  7. Thoroughly rinse your body with warm water from the neck down.  8. DO NOT shower/wash with your normal soap after using and rinsing off the CHG Soap.  9. Pat yourself dry with a CLEAN TOWEL.  10. Wear CLEAN PAJAMAS to bed the night before surgery, wear comfortable clothes the morning of surgery  11. Place CLEAN SHEETS on your bed the night of your first shower and DO NOT SLEEP WITH PETS.    Day of Surgery:  Do not apply any deodorants/lotions. Please shower the morning of surgery with the CHG soap  Please wear clean clothes to the hospital/surgery center.   Remember to brush your teeth WITH YOUR REGULAR TOOTHPASTE.

## 2019-05-21 ENCOUNTER — Other Ambulatory Visit (HOSPITAL_COMMUNITY)
Admission: RE | Admit: 2019-05-21 | Discharge: 2019-05-21 | Disposition: A | Discharge status: Home / Self Care | Payer: HRSA Program | Source: Ambulatory Visit | Attending: Orthopaedic Surgery | Admitting: Orthopaedic Surgery

## 2019-05-21 ENCOUNTER — Other Ambulatory Visit: Payer: Self-pay

## 2019-05-21 ENCOUNTER — Encounter (HOSPITAL_COMMUNITY): Payer: Self-pay

## 2019-05-21 ENCOUNTER — Encounter (HOSPITAL_COMMUNITY)
Admission: RE | Admit: 2019-05-21 | Discharge: 2019-05-21 | Disposition: A | Discharge status: Home / Self Care | Payer: Worker's Compensation | Source: Ambulatory Visit | Attending: Orthopaedic Surgery | Admitting: Orthopaedic Surgery

## 2019-05-21 DIAGNOSIS — Z01812 Encounter for preprocedural laboratory examination: Secondary | ICD-10-CM | POA: Insufficient documentation

## 2019-05-21 DIAGNOSIS — Z20828 Contact with and (suspected) exposure to other viral communicable diseases: Secondary | ICD-10-CM | POA: Insufficient documentation

## 2019-05-21 LAB — CBC
HCT: 43.3 % (ref 39.0–52.0)
Hemoglobin: 15.1 g/dL (ref 13.0–17.0)
MCH: 31.7 pg (ref 26.0–34.0)
MCHC: 34.9 g/dL (ref 30.0–36.0)
MCV: 91 fL (ref 80.0–100.0)
Platelets: 275 10*3/uL (ref 150–400)
RBC: 4.76 MIL/uL (ref 4.22–5.81)
RDW: 12.4 % (ref 11.5–15.5)
WBC: 8.7 10*3/uL (ref 4.0–10.5)
nRBC: 0 % (ref 0.0–0.2)

## 2019-05-21 LAB — SURGICAL PCR SCREEN
MRSA, PCR: NEGATIVE
Staphylococcus aureus: NEGATIVE

## 2019-05-22 LAB — NOVEL CORONAVIRUS, NAA (HOSP ORDER, SEND-OUT TO REF LAB; TAT 18-24 HRS): SARS-CoV-2, NAA: NOT DETECTED

## 2019-05-25 ENCOUNTER — Ambulatory Visit (HOSPITAL_COMMUNITY): Payer: Worker's Compensation | Admitting: Certified Registered Nurse Anesthetist

## 2019-05-25 ENCOUNTER — Encounter (HOSPITAL_COMMUNITY): Payer: Self-pay | Admitting: Certified Registered Nurse Anesthetist

## 2019-05-25 ENCOUNTER — Ambulatory Visit (HOSPITAL_COMMUNITY): Payer: BLUE CROSS/BLUE SHIELD | Attending: Orthopaedic Surgery

## 2019-05-25 ENCOUNTER — Ambulatory Visit (HOSPITAL_COMMUNITY)
Admission: RE | Admit: 2019-05-25 | Discharge: 2019-05-25 | Disposition: A | Discharge status: Home / Self Care | Payer: Worker's Compensation | Attending: Orthopaedic Surgery | Admitting: Orthopaedic Surgery

## 2019-05-25 ENCOUNTER — Encounter (HOSPITAL_COMMUNITY)
Admission: RE | Disposition: A | Discharge status: Home / Self Care | Payer: Self-pay | Source: Home / Self Care | Attending: Orthopaedic Surgery

## 2019-05-25 ENCOUNTER — Other Ambulatory Visit: Payer: Self-pay

## 2019-05-25 DIAGNOSIS — X500XXA Overexertion from strenuous movement or load, initial encounter: Secondary | ICD-10-CM | POA: Insufficient documentation

## 2019-05-25 DIAGNOSIS — Z79899 Other long term (current) drug therapy: Secondary | ICD-10-CM | POA: Diagnosis not present

## 2019-05-25 DIAGNOSIS — E669 Obesity, unspecified: Secondary | ICD-10-CM | POA: Diagnosis not present

## 2019-05-25 DIAGNOSIS — T1490XA Injury, unspecified, initial encounter: Secondary | ICD-10-CM | POA: Diagnosis present

## 2019-05-25 DIAGNOSIS — S82872A Displaced pilon fracture of left tibia, initial encounter for closed fracture: Secondary | ICD-10-CM | POA: Insufficient documentation

## 2019-05-25 DIAGNOSIS — Z6835 Body mass index (BMI) 35.0-35.9, adult: Secondary | ICD-10-CM | POA: Insufficient documentation

## 2019-05-25 DIAGNOSIS — S82392A Other fracture of lower end of left tibia, initial encounter for closed fracture: Secondary | ICD-10-CM | POA: Diagnosis present

## 2019-05-25 HISTORY — PX: SYNDESMOSIS REPAIR: SHX5182

## 2019-05-25 HISTORY — PX: ORIF ANKLE FRACTURE: SHX5408

## 2019-05-25 SURGERY — OPEN REDUCTION INTERNAL FIXATION (ORIF) ANKLE FRACTURE
Anesthesia: Regional | Site: Ankle | Laterality: Left

## 2019-05-25 MED ORDER — PHENYLEPHRINE HCL-NACL 10-0.9 MG/250ML-% IV SOLN
INTRAVENOUS | Status: DC | PRN
  Administered 2019-05-25: 20 ug/min via INTRAVENOUS

## 2019-05-25 MED ORDER — MIDAZOLAM HCL 2 MG/2ML IJ SOLN
INTRAMUSCULAR | Status: AC
  Administered 2019-05-25: 2 mg via INTRAVENOUS
  Filled 2019-05-25: qty 2

## 2019-05-25 MED ORDER — FENTANYL CITRATE (PF) 100 MCG/2ML IJ SOLN
INTRAMUSCULAR | Status: DC | PRN
  Administered 2019-05-25: 100 ug via INTRAVENOUS
  Administered 2019-05-25: 50 ug via INTRAVENOUS
  Administered 2019-05-25: 100 ug via INTRAVENOUS

## 2019-05-25 MED ORDER — OXYCODONE HCL 5 MG PO TABS: 5.0000 mg | ORAL_TABLET | ORAL | 0 refills | Status: AC | PRN

## 2019-05-25 MED ORDER — PROPOFOL 10 MG/ML IV BOLUS
INTRAVENOUS | Status: DC | PRN
  Administered 2019-05-25: 200 mg via INTRAVENOUS

## 2019-05-25 MED ORDER — ESMOLOL HCL 100 MG/10ML IV SOLN
INTRAVENOUS | Status: AC
  Filled 2019-05-25: qty 10

## 2019-05-25 MED ORDER — MIDAZOLAM HCL 2 MG/2ML IJ SOLN
2.0000 mg | Freq: Once | INTRAMUSCULAR | Status: AC
  Administered 2019-05-25: 09:00:00 2 mg via INTRAVENOUS

## 2019-05-25 MED ORDER — ROPIVACAINE HCL 5 MG/ML IJ SOLN
INTRAMUSCULAR | Status: DC | PRN
  Administered 2019-05-25: 30 mL via PERINEURAL

## 2019-05-25 MED ORDER — PHENYLEPHRINE HCL (PRESSORS) 10 MG/ML IV SOLN
INTRAVENOUS | Status: DC | PRN
  Administered 2019-05-25: 120 ug via INTRAVENOUS
  Administered 2019-05-25: 80 ug via INTRAVENOUS
  Administered 2019-05-25: 120 ug via INTRAVENOUS

## 2019-05-25 MED ORDER — FENTANYL CITRATE (PF) 100 MCG/2ML IJ SOLN
INTRAMUSCULAR | Status: AC
  Administered 2019-05-25: 100 ug via INTRAVENOUS
  Filled 2019-05-25: qty 2

## 2019-05-25 MED ORDER — LIDOCAINE HCL (PF) 1 % IJ SOLN
INTRAMUSCULAR | Status: DC | PRN
  Administered 2019-05-25: 10 mL

## 2019-05-25 MED ORDER — MIDAZOLAM HCL 2 MG/2ML IJ SOLN
INTRAMUSCULAR | Status: AC
  Filled 2019-05-25: qty 2

## 2019-05-25 MED ORDER — DEXAMETHASONE SODIUM PHOSPHATE 10 MG/ML IJ SOLN
INTRAMUSCULAR | Status: DC | PRN
  Administered 2019-05-25: 5 mg via INTRAVENOUS
  Administered 2019-05-25: 10 mg

## 2019-05-25 MED ORDER — ONDANSETRON HCL 4 MG/2ML IJ SOLN
INTRAMUSCULAR | Status: DC | PRN
  Administered 2019-05-25: 4 mg via INTRAVENOUS

## 2019-05-25 MED ORDER — 0.9 % SODIUM CHLORIDE (POUR BTL) OPTIME
TOPICAL | Status: DC | PRN
  Administered 2019-05-25: 10:00:00 1000 mL

## 2019-05-25 MED ORDER — SUGAMMADEX SODIUM 200 MG/2ML IV SOLN
INTRAVENOUS | Status: DC | PRN
  Administered 2019-05-25: 400 mg via INTRAVENOUS

## 2019-05-25 MED ORDER — CEFAZOLIN SODIUM-DEXTROSE 2-4 GM/100ML-% IV SOLN
2.0000 g | INTRAVENOUS | Status: AC
  Administered 2019-05-25: 2 g via INTRAVENOUS
  Filled 2019-05-25: qty 100

## 2019-05-25 MED ORDER — SUCCINYLCHOLINE CHLORIDE 200 MG/10ML IV SOSY
PREFILLED_SYRINGE | INTRAVENOUS | Status: AC
  Filled 2019-05-25: qty 10

## 2019-05-25 MED ORDER — FENTANYL CITRATE (PF) 100 MCG/2ML IJ SOLN
100.0000 ug | Freq: Once | INTRAMUSCULAR | Status: AC
  Administered 2019-05-25: 09:00:00 100 ug via INTRAVENOUS

## 2019-05-25 MED ORDER — MIDAZOLAM HCL 5 MG/5ML IJ SOLN
INTRAMUSCULAR | Status: DC | PRN
  Administered 2019-05-25: 2 mg via INTRAVENOUS

## 2019-05-25 MED ORDER — LACTATED RINGERS IV SOLN
INTRAVENOUS | Status: DC
  Administered 2019-05-25 (×2): via INTRAVENOUS

## 2019-05-25 MED ORDER — POVIDONE-IODINE 10 % EX SWAB
2.0000 "application " | Freq: Once | CUTANEOUS | Status: AC
  Administered 2019-05-25: 2 via TOPICAL

## 2019-05-25 MED ORDER — ROCURONIUM BROMIDE 100 MG/10ML IV SOLN
INTRAVENOUS | Status: DC | PRN
  Administered 2019-05-25: 80 mg via INTRAVENOUS
  Administered 2019-05-25: 20 mg via INTRAVENOUS

## 2019-05-25 MED ORDER — SUCCINYLCHOLINE CHLORIDE 20 MG/ML IJ SOLN
INTRAMUSCULAR | Status: DC | PRN
  Administered 2019-05-25: 100 mg via INTRAVENOUS

## 2019-05-25 MED ORDER — LIDOCAINE HCL (CARDIAC) PF 100 MG/5ML IV SOSY
PREFILLED_SYRINGE | INTRAVENOUS | Status: DC | PRN
  Administered 2019-05-25: 40 mg via INTRAVENOUS

## 2019-05-25 MED ORDER — PROPOFOL 10 MG/ML IV BOLUS
INTRAVENOUS | Status: AC
  Filled 2019-05-25: qty 20

## 2019-05-25 MED ORDER — FENTANYL CITRATE (PF) 250 MCG/5ML IJ SOLN
INTRAMUSCULAR | Status: AC
  Filled 2019-05-25: qty 5

## 2019-05-25 MED ORDER — LIDOCAINE 2% (20 MG/ML) 5 ML SYRINGE
INTRAMUSCULAR | Status: AC
  Filled 2019-05-25: qty 5

## 2019-05-25 SURGICAL SUPPLY — 64 items
ALCOHOL 70% 16 OZ (MISCELLANEOUS) ×3 IMPLANT
BANDAGE ESMARK 6X9 LF (GAUZE/BANDAGES/DRESSINGS) IMPLANT
BIT DRILL 2 LNG CALIBR (DRILL) ×3 IMPLANT
BIT DRILL 2.5 CANN STRL (BIT) ×3 IMPLANT
BLADE SURG 15 STRL LF DISP TIS (BLADE) ×1 IMPLANT
BLADE SURG 15 STRL SS (BLADE) ×2
BNDG COHESIVE 4X5 TAN STRL (GAUZE/BANDAGES/DRESSINGS) IMPLANT
BNDG COHESIVE 6X5 TAN STRL LF (GAUZE/BANDAGES/DRESSINGS) IMPLANT
BNDG ELASTIC 6X10 VLCR STRL LF (GAUZE/BANDAGES/DRESSINGS) ×3 IMPLANT
BNDG ESMARK 6X9 LF (GAUZE/BANDAGES/DRESSINGS)
CANISTER SUCT 3000ML PPV (MISCELLANEOUS) ×3 IMPLANT
CHLORAPREP W/TINT 26 (MISCELLANEOUS) ×6 IMPLANT
COVER SURGICAL LIGHT HANDLE (MISCELLANEOUS) ×3 IMPLANT
COVER WAND RF STERILE (DRAPES) ×3 IMPLANT
CUFF TOURN SGL QUICK 34 (TOURNIQUET CUFF) ×2
CUFF TOURN SGL QUICK 42 (TOURNIQUET CUFF) IMPLANT
CUFF TRNQT CYL 34X4.125X (TOURNIQUET CUFF) ×1 IMPLANT
DRAPE C-ARM 42X72 X-RAY (DRAPES) ×3 IMPLANT
DRAPE C-ARMOR (DRAPES) ×3 IMPLANT
DRAPE OEC MINIVIEW 54X84 (DRAPES) ×3 IMPLANT
DRAPE U-SHAPE 47X51 STRL (DRAPES) ×3 IMPLANT
DRSG MEPITEL 4X7.2 (GAUZE/BANDAGES/DRESSINGS) ×3 IMPLANT
DRSG PAD ABDOMINAL 8X10 ST (GAUZE/BANDAGES/DRESSINGS) ×3 IMPLANT
DRSG XEROFORM 1X8 (GAUZE/BANDAGES/DRESSINGS) ×3 IMPLANT
ELECT REM PT RETURN 9FT ADLT (ELECTROSURGICAL) ×3
ELECTRODE REM PT RTRN 9FT ADLT (ELECTROSURGICAL) ×1 IMPLANT
GAUZE SPONGE 4X4 12PLY STRL (GAUZE/BANDAGES/DRESSINGS) ×3 IMPLANT
GAUZE SPONGE 4X4 12PLY STRL LF (GAUZE/BANDAGES/DRESSINGS) ×3 IMPLANT
GLOVE BIOGEL M STRL SZ7.5 (GLOVE) ×3 IMPLANT
GLOVE BIOGEL PI IND STRL 8 (GLOVE) ×1 IMPLANT
GLOVE BIOGEL PI INDICATOR 8 (GLOVE) ×2
GOWN STRL REUS W/ TWL LRG LVL3 (GOWN DISPOSABLE) ×1 IMPLANT
GOWN STRL REUS W/ TWL XL LVL3 (GOWN DISPOSABLE) ×1 IMPLANT
GOWN STRL REUS W/TWL LRG LVL3 (GOWN DISPOSABLE) ×2
GOWN STRL REUS W/TWL XL LVL3 (GOWN DISPOSABLE) ×2
GUIDEWIRE 1.35MM (WIRE) ×6 IMPLANT
K-WIRE BB-TAK (WIRE) ×3
KIT BASIN OR (CUSTOM PROCEDURE TRAY) ×3 IMPLANT
KIT TURNOVER KIT B (KITS) ×3 IMPLANT
KWIRE BB-TAK (WIRE) ×1 IMPLANT
NS IRRIG 1000ML POUR BTL (IV SOLUTION) ×3 IMPLANT
PACK ORTHO EXTREMITY (CUSTOM PROCEDURE TRAY) ×3 IMPLANT
PAD ARMBOARD 7.5X6 YLW CONV (MISCELLANEOUS) ×6 IMPLANT
PAD CAST 4YDX4 CTTN HI CHSV (CAST SUPPLIES) ×1 IMPLANT
PADDING CAST COTTON 4X4 STRL (CAST SUPPLIES) ×2
PLATE TIB PD SS 3H (Plate) ×3 IMPLANT
SCREW BONE 3.5X42MM CORTICAL (Screw) ×6 IMPLANT
SCREW CORTEX 2.7X24MM LP SS (Screw) ×3 IMPLANT
SCREW LOW PROFILE 3.5X30 (Screw) ×3 IMPLANT
SCREW NON-LOCKING 3.5X22MM (Screw) ×3 IMPLANT
SPONGE LAP 18X18 RF (DISPOSABLE) ×3 IMPLANT
STAPLER VISISTAT (STAPLE) ×3 IMPLANT
SUCTION FRAZIER HANDLE 10FR (MISCELLANEOUS) ×2
SUCTION TUBE FRAZIER 10FR DISP (MISCELLANEOUS) ×1 IMPLANT
SUT ETHILON 3 0 PS 1 (SUTURE) ×3 IMPLANT
SUT MNCRL AB 3-0 PS2 18 (SUTURE) IMPLANT
SUT MON AB 3-0 SH 27 (SUTURE) ×4
SUT MON AB 3-0 SH27 (SUTURE) ×2 IMPLANT
SUT VIC AB 2-0 CT1 27 (SUTURE) ×4
SUT VIC AB 2-0 CT1 TAPERPNT 27 (SUTURE) ×2 IMPLANT
TOWEL GREEN STERILE (TOWEL DISPOSABLE) ×3 IMPLANT
TOWEL GREEN STERILE FF (TOWEL DISPOSABLE) ×3 IMPLANT
TUBE CONNECTING 12'X1/4 (SUCTIONS) ×1
TUBE CONNECTING 12X1/4 (SUCTIONS) ×2 IMPLANT

## 2019-05-25 NOTE — Discharge Instructions (Signed)
DR. Kirstine Jacquin FOOT & ANKLE SURGERY POST-OP INSTRUCTIONS   Pain Management 1. The numbing medicine and your leg will last around 18 hours, take a dose of your pain medicine as soon as you feel it wearing off to avoid rebound pain. 2. Keep your foot elevated above heart level.  Make sure that your heel hangs free ('floats'). 3. Take all prescribed medication as directed. 4. If taking narcotic pain medication you may want to use an over-the-counter stool softener to avoid constipation. 5. You may take over-the-counter NSAIDs (ibuprofen, naproxen, etc.) as well as over-the-counter acetaminophen as directed on the packaging as a supplement for your pain and may also use it to wean away from the prescription medication.  Activity ? Non-weightbearing ? Keep splint intact  First Postoperative Visit 1. Your first postop visit will be at least 2 weeks after surgery.  This should be scheduled when you schedule surgery. 2. If you do not have a postoperative visit scheduled please call [phone] to schedule an appointment. 3. At the appointment your incision will be evaluated for suture removal, x-rays will be obtained if necessary.  General Instructions 1. Swelling is very common after foot and ankle surgery.  It often takes 3 months for the foot and ankle to begin to feel comfortable.  Some amount of swelling will persist for 6-12 months. 2. DO NOT change the dressing.  If there is a problem with the dressing (too tight, loose, gets wet, etc.) please contact Dr. Dreamer Carillo's office. 3. DO NOT get the dressing wet.  For showers you can use an over-the-counter cast cover or wrap a washcloth around the top of your dressing and then cover it with a plastic bag and tape it to your leg. 4. DO NOT soak the incision (no tubs, pools, bath, etc.) until you have approval from Dr. Averi Cacioppo.  Contact Dr. Adairs office or go to Emergency Room if: 1. Temperature above 101 F. 2. Increasing pain that is unresponsive to pain  medication or elevation 3. Excessive redness or swelling in your foot 4. Dressing problems - excessive bloody drainage, looseness or tightness, or if dressing gets wet 5. Develop pain, swelling, warmth, or discoloration of your calf  

## 2019-05-25 NOTE — Transfer of Care (Signed)
Immediate Anesthesia Transfer of Care Note  Patient: Daniel Washington  Procedure(s) Performed: OPEN REDUCTION INTERNAL FIXATION (ORIF) PILON ANKLE FRACTURE (Left Ankle) SYNDESMOSIS REPAIR (Left Ankle)  Patient Location: PACU  Anesthesia Type:GA combined with regional for post-op pain  Level of Consciousness: drowsy  Airway & Oxygen Therapy: Patient Spontanous Breathing and Patient connected to nasal cannula oxygen  Post-op Assessment: Report given to RN, Post -op Vital signs reviewed and stable and Patient moving all extremities  Post vital signs: Reviewed and stable  Last Vitals:  Vitals Value Taken Time  BP 122/69 05/25/19 1143  Temp    Pulse 84 05/25/19 1145  Resp 9 05/25/19 1145  SpO2 100 % 05/25/19 1145  Vitals shown include unvalidated device data.  Last Pain:  Vitals:   05/25/19 0852  TempSrc: Oral  PainSc:          Complications: No apparent anesthesia complications

## 2019-05-25 NOTE — Op Note (Signed)
Daniel Washington male 33 y.o. 05/25/2019  PreOperative Diagnosis: Left intra-articular distal tibia fracture  PostOperative Diagnosis: Left intra-articular distal tibia fracture  PROCEDURE: Open treatment of the weightbearing surface of distal tibia fracture without fibular fixation Ankle stress view under fluoroscopy  SURGEON: Melony Overly, MD  ASSISTANT: RNFA  ANESTHESIA: General with peripheral nerve block  FINDINGS: Intra-articular distal tibia fracture Stable syndesmosis and medial clear space after tibial fixation  IMPLANTS: Arthrex distal tibial plate  [id number]:33 y.o. male sustained the above injury approximately 1 month ago.  He was seen in outside provider where he was diagnosed with a fracture and an MRI was obtained treated apparent syndesmotic widening.  He was seen in my clinic and indicated for the above treatment.  He understood the risk benefits alternatives of surgery which include but not limited to wound healing complications, infection, nonunion, malunion, need for further surgery as well as damage surrounding structures.  He also understood the possibility of development of posttraumatic arthritis and continued pain.  He understood the perioperative and anesthetic risk which include death.  He wished to proceed with surgery.  PROCEDURE: Patient was identified in the preoperative holding area.  The left leg was marked myself.  Consent was signed myself and the patient.  Anesthesia performed peripheral nerve block.  He was taken to the operative suite where general anesthesia was induced without difficulty.  He was placed on the operative table in the prone position with chest rolls and all bony prominences well-padded.  Thigh tourniquet was placed on the left thigh.  Preoperative antibiotics were given.  Surgical timeout was performed.  The left lower extremity was prepped and draped in the usual sterile fashion and then elevated and the tourniquet was  inflated to 250 mmHg.  We began by making a longitudinal incision posterior to the fibula on the lateral aspect of the ankle in a posterior approach to the distal tibia given the posterior fragmentation of the fracture site and intra-articular nature there.  This was taken sharply down through skin and subcutaneous tissue down to the peroneal retinaculum.  The peroneal tendon sheath and retinaculum was incised.  The peroneal tendons were then mobilized anteriorly and the interval between the peroneal tendons and the toe flexor tendons was created and blunt dissection was used to identify and use this interval.  We then exposed the posterior aspect of the tibia and a periosteal elevator was used to mobilize soft tissues off the posterior aspect of the tibia.  The fracture site was identified and there was robust callus formation there given the length of time since the injury.  This was all mobilized and the fracture site was then reduced under direct visualization and held provisionally with K wire fixation.  The plate was placed over this area in appropriate position of the fracture site and plate placement was confirmed on fluoroscopy.  Then a combination of nonlocking and locking screws were placed to fix the fracture site.  After this the joint surface was reduced acceptably.  Fixation was acceptable.  We then turned our attention to the syndesmosis.  Mortise and syndesmotic stress views were obtained within the operative suite and found to be stable with regard to the syndesmosis.  There is no medial clear space widening.  We then released the tourniquet.  Irrigation was performed about the wound.  This was used with normal saline.  We then closed the deep tissue and overlying peroneal sheath with a 3-0 Monocryl stitch.  The subcuticular tissue was closed  with a 3-0 Monocryl.  Skin was closed with staples.  Soft dressing was placed.  He was placed in a short leg nonweightbearing splint.  He tolerated  procedure well.  Counts were correct at the end the case.  There are no complications.  He is awake from anesthesia and taken recovery in stable condition.  POST OPERATIVE INSTRUCTIONS: Nonweightbearing to left lower extremity Follow-up in 2 weeks for wound check, staple removal and x-rays of the left ankle, nonweightbearing on arrival Continue full-strength aspirin for DVT prophylaxis Call the office with concerns  TOURNIQUET TIME: 42 minutes  BLOOD LOSS:  Minimal         DRAINS: none         SPECIMEN: none       COMPLICATIONS:  * No complications entered in OR log *         Disposition: PACU - hemodynamically stable.         Condition: stable

## 2019-05-25 NOTE — H&P (Signed)
Daniel Washington is an 33 y.o. male.   Chief Complaint: Left pilon ankle fracture HPI: Daniel Washington is a 33 year old male here for evaluation and surgical intervention of his left ankle fracture.  He sustained an ankle fracture approximately 1 month ago when he hyperflexed his foot.  He sustained a displaced posterior tibial plafond fracture.  There was concern for syndesmotic widening therefore MRI was performed and showed some injury to the syndesmosis.  Patient has been maintaining nonweightbearing.  His pain is been improving.  He is here for surgery.  History reviewed. No pertinent past medical history.  Past Surgical History:  Procedure Laterality Date  . WISDOM TOOTH EXTRACTION      History reviewed. No pertinent family history. Social History:  reports that he has never smoked. He has never used smokeless tobacco. He reports current alcohol use. He reports that he does not use drugs.  Allergies: No Known Allergies  Medications Prior to Admission  Medication Sig Dispense Refill  . acetaminophen (TYLENOL) 500 MG tablet Take 1,000 mg by mouth every 6 (six) hours as needed.    Marland Kitchen ibuprofen (ADVIL) 200 MG tablet Take 200 mg by mouth every 6 (six) hours as needed.    Marland Kitchen oxyCODONE-acetaminophen (PERCOCET) 5-325 MG tablet Take 1 tablet by mouth every 6 (six) hours as needed for severe pain. (Patient not taking: Reported on 05/17/2019) 20 tablet 0    No results found for this or any previous visit (from the past 48 hour(s)). No results found.  Review of Systems  Constitutional: Negative.   HENT: Negative.   Eyes: Negative.   Respiratory: Negative.   Cardiovascular: Negative.   Musculoskeletal:       Left ankle pain  Skin: Negative.   Neurological: Negative.   Psychiatric/Behavioral: Negative.     Blood pressure (!) 144/89, pulse 92, temperature 98.3 F (36.8 C), temperature source Oral, resp. rate 20, height 5\' 7"  (1.702 m), weight 102.8 kg, SpO2 99 %. Physical Exam  Vitals  reviewed. Constitutional: He appears well-developed.  HENT:  Head: Normocephalic.  Eyes: Conjunctivae are normal.  Neck: Neck supple.  Cardiovascular: Normal rate.  Respiratory: Effort normal.  GI: Soft.  Musculoskeletal:     Comments: Left ankle in a splint.  Toes exposed are warm and well-perfused.  No tenderness to the forefoot.  No tenderness proximal to the splint.  No skin lacerations visible.  Endorses sensation light touch about the toes.  Wiggles toes.  Neurological: He is alert.  Skin: Skin is warm.  Psychiatric: He has a normal mood and affect.     Assessment/Plan We will proceed with open reduction internal fixation of his distal tibial intra-articular fracture.  We will also stress the syndesmosis after fixation and fix this if needed.  He understands the risk benefits alternatives surgery which include but not limited to wound healing complications, infection, nonunion, malunion, need for further surgery, damage to structures and development of osteoarthritis.  He wishes to proceed despite these risks.  He also understands the perioperative and anesthetic risk which include death.  Erle Crocker, MD 05/25/2019, 9:29 AM

## 2019-05-25 NOTE — Anesthesia Preprocedure Evaluation (Addendum)
Anesthesia Evaluation  Patient identified by MRN, date of birth, ID band Patient awake    Reviewed: Allergy & Precautions, NPO status , Patient's Chart, lab work & pertinent test results  Airway Mallampati: II  TM Distance: >3 FB Neck ROM: Full    Dental no notable dental hx. (+) Teeth Intact, Dental Advisory Given   Pulmonary neg pulmonary ROS,    Pulmonary exam normal breath sounds clear to auscultation       Cardiovascular negative cardio ROS Normal cardiovascular exam Rhythm:Regular Rate:Normal     Neuro/Psych negative neurological ROS  negative psych ROS   GI/Hepatic negative GI ROS, Neg liver ROS,   Endo/Other  negative endocrine ROS  Renal/GU negative Renal ROS  negative genitourinary   Musculoskeletal negative musculoskeletal ROS (+)   Abdominal (+) + obese,   Peds negative pediatric ROS (+)  Hematology negative hematology ROS (+)   Anesthesia Other Findings Left pilon fx s/p pallet falling at work  Reproductive/Obstetrics negative OB ROS                            Anesthesia Physical Anesthesia Plan  ASA: II  Anesthesia Plan: Regional and General   Post-op Pain Management: GA combined w/ Regional for post-op pain   Induction: Intravenous  PONV Risk Score and Plan: 2 and Treatment may vary due to age or medical condition, Ondansetron, Dexamethasone and Midazolam  Airway Management Planned: Oral ETT  Additional Equipment: None  Intra-op Plan:   Post-operative Plan: Extubation in OR  Informed Consent: I have reviewed the patients History and Physical, chart, labs and discussed the procedure including the risks, benefits and alternatives for the proposed anesthesia with the patient or authorized representative who has indicated his/her understanding and acceptance.     Dental advisory given  Plan Discussed with: CRNA  Anesthesia Plan Comments: (Oral ETT d/t prone  positioning)       Anesthesia Quick Evaluation

## 2019-05-25 NOTE — Anesthesia Postprocedure Evaluation (Signed)
Anesthesia Post Note  Patient: Daniel Washington  Procedure(s) Performed: OPEN REDUCTION INTERNAL FIXATION (ORIF) PILON ANKLE FRACTURE (Left Ankle) SYNDESMOSIS REPAIR (Left Ankle)     Patient location during evaluation: PACU Anesthesia Type: Regional and General Level of consciousness: awake and alert, oriented and patient cooperative Pain management: pain level controlled Vital Signs Assessment: post-procedure vital signs reviewed and stable Respiratory status: spontaneous breathing, nonlabored ventilation and respiratory function stable Cardiovascular status: blood pressure returned to baseline and stable Postop Assessment: no apparent nausea or vomiting Anesthetic complications: no    Last Vitals:  Vitals:   05/25/19 1213 05/25/19 1215  BP:  (!) 144/88  Pulse: 92 (!) 103  Resp: 17 20  Temp:  (!) 36.1 C  SpO2: 97% 96%    Last Pain:  Vitals:   05/25/19 1215  TempSrc:   PainSc: 0-No pain                 Pervis Hocking

## 2019-05-25 NOTE — Anesthesia Procedure Notes (Signed)
Procedure Name: Intubation Date/Time: 05/25/2019 10:03 AM Performed by: Brydon Spahr T, CRNA Pre-anesthesia Checklist: Patient identified, Emergency Drugs available, Suction available and Patient being monitored Patient Re-evaluated:Patient Re-evaluated prior to induction Oxygen Delivery Method: Circle system utilized Preoxygenation: Pre-oxygenation with 100% oxygen Induction Type: IV induction Ventilation: Mask ventilation without difficulty Laryngoscope Size: Miller and 3 Grade View: Grade I Tube type: Oral Tube size: 7.5 mm Number of attempts: 1 Airway Equipment and Method: Patient positioned with wedge pillow and Stylet Placement Confirmation: ETT inserted through vocal cords under direct vision,  positive ETCO2 and breath sounds checked- equal and bilateral Secured at: 23 cm Tube secured with: Tape Dental Injury: Teeth and Oropharynx as per pre-operative assessment

## 2019-05-25 NOTE — Anesthesia Procedure Notes (Signed)
Anesthesia Regional Block: Popliteal block   Pre-Anesthetic Checklist: ,, timeout performed, Correct Patient, Correct Site, Correct Laterality, Correct Procedure, Correct Position, site marked, Risks and benefits discussed,  Surgical consent,  Pre-op evaluation,  At surgeon's request and post-op pain management  Laterality: Left  Prep: Maximum Sterile Barrier Precautions used, chloraprep       Needles:  Injection technique: Single-shot  Needle Type: Echogenic Stimulator Needle     Needle Length: 9cm  Needle Gauge: 22     Additional Needles:   Procedures:,,,, ultrasound used (permanent image in chart),,,,  Narrative:  Start time: 05/25/2019 9:25 AM End time: 05/25/2019 9:30 AM Injection made incrementally with aspirations every 5 mL.  Performed by: Personally  Anesthesiologist: Pervis Hocking, DO  Additional Notes: Monitors applied. No increased pain on injection. No increased resistance to injection. Injection made in 5cc increments. Good needle visualization. Patient tolerated procedure well.

## 2019-05-25 NOTE — Anesthesia Procedure Notes (Signed)
Anesthesia Regional Block: Adductor canal block   Pre-Anesthetic Checklist: ,, timeout performed, Correct Patient, Correct Site, Correct Laterality, Correct Procedure, Correct Position, site marked, Risks and benefits discussed,  Surgical consent,  Pre-op evaluation,  At surgeon's request and post-op pain management  Laterality: Left  Prep: Maximum Sterile Barrier Precautions used, chloraprep       Needles:  Injection technique: Single-shot  Needle Type: Echogenic Stimulator Needle     Needle Length: 9cm  Needle Gauge: 22     Additional Needles:   Procedures:,,,, ultrasound used (permanent image in chart),,,,  Narrative:  Start time: 05/25/2019 9:30 AM End time: 05/25/2019 9:35 AM Injection made incrementally with aspirations every 5 mL.  Performed by: Personally  Anesthesiologist: Pervis Hocking, DO  Additional Notes: Monitors applied. No increased pain on injection. No increased resistance to injection. Injection made in 5cc increments. Good needle visualization. Patient tolerated procedure well.

## 2019-05-26 ENCOUNTER — Encounter (HOSPITAL_COMMUNITY): Payer: Self-pay | Admitting: Orthopaedic Surgery

## 2020-09-08 IMAGING — CR DG KNEE COMPLETE 4+V*L*
4 series · 4 of 4 positions shown · non-contrast
Comparison: None.

CLINICAL DATA: Pallet fell on leg, while at work pain radiating
from the ankle

EXAM:
LEFT KNEE - COMPLETE 4+ VIEW

[t knee ap left]
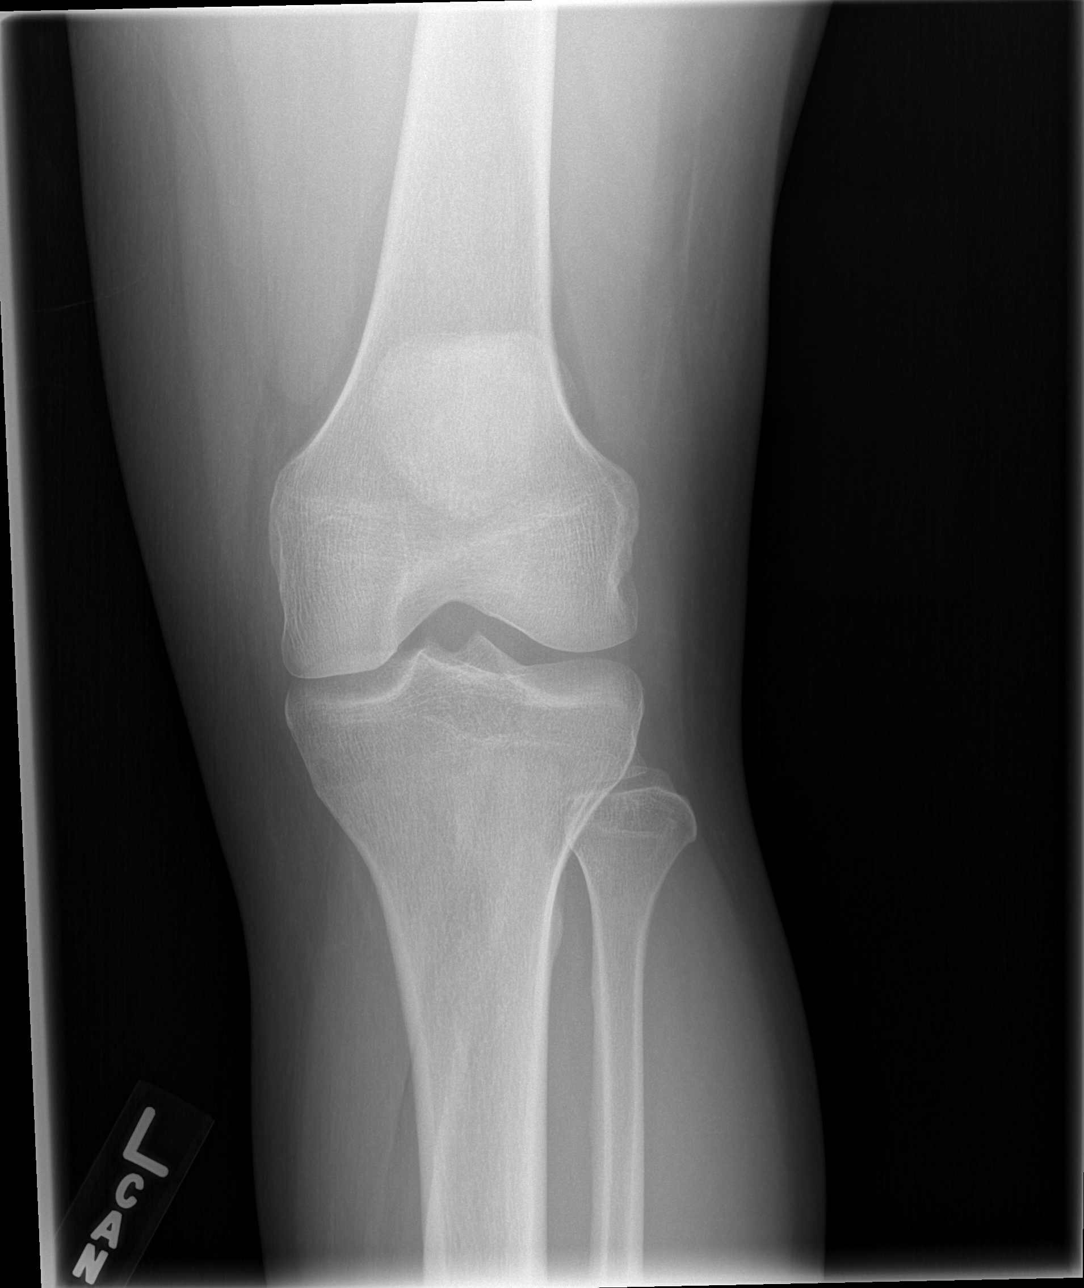

[t knee oblique left (1 of 2)]
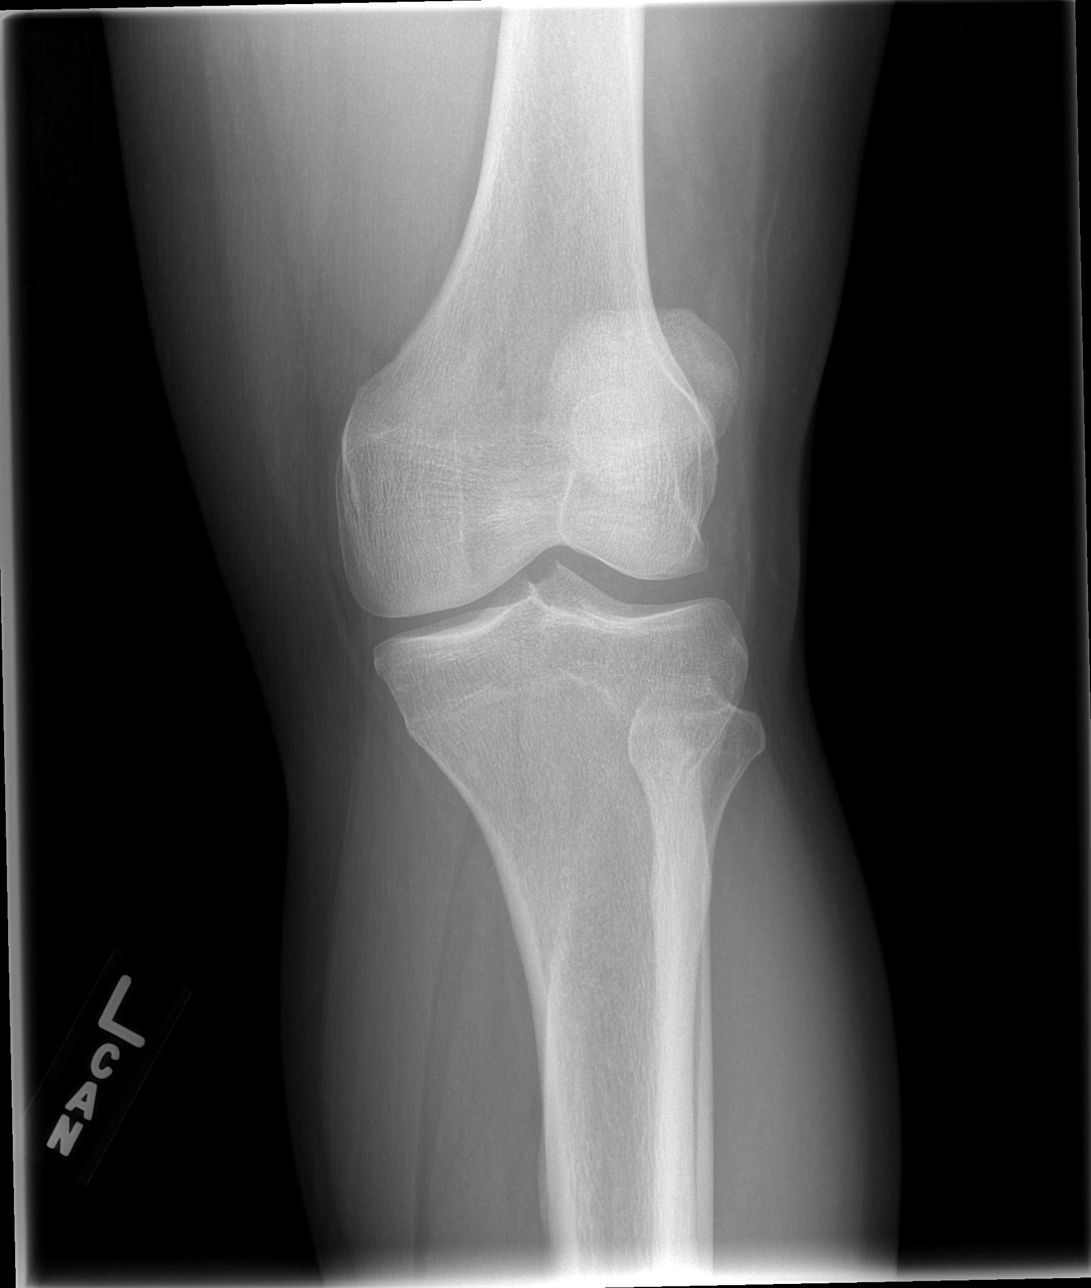

[t knee oblique left (2 of 2)]
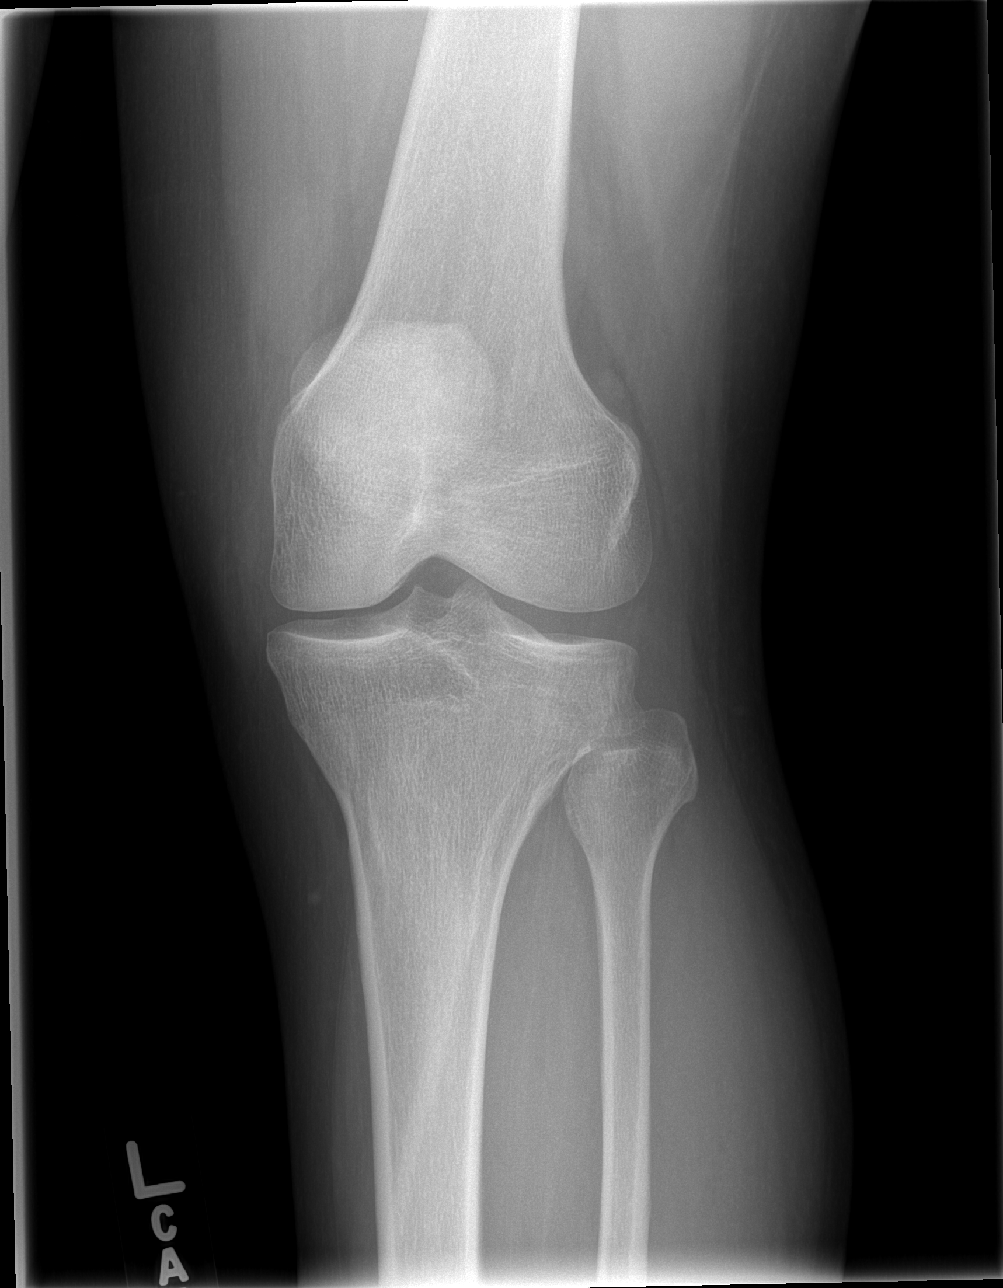

[t knee lat left]
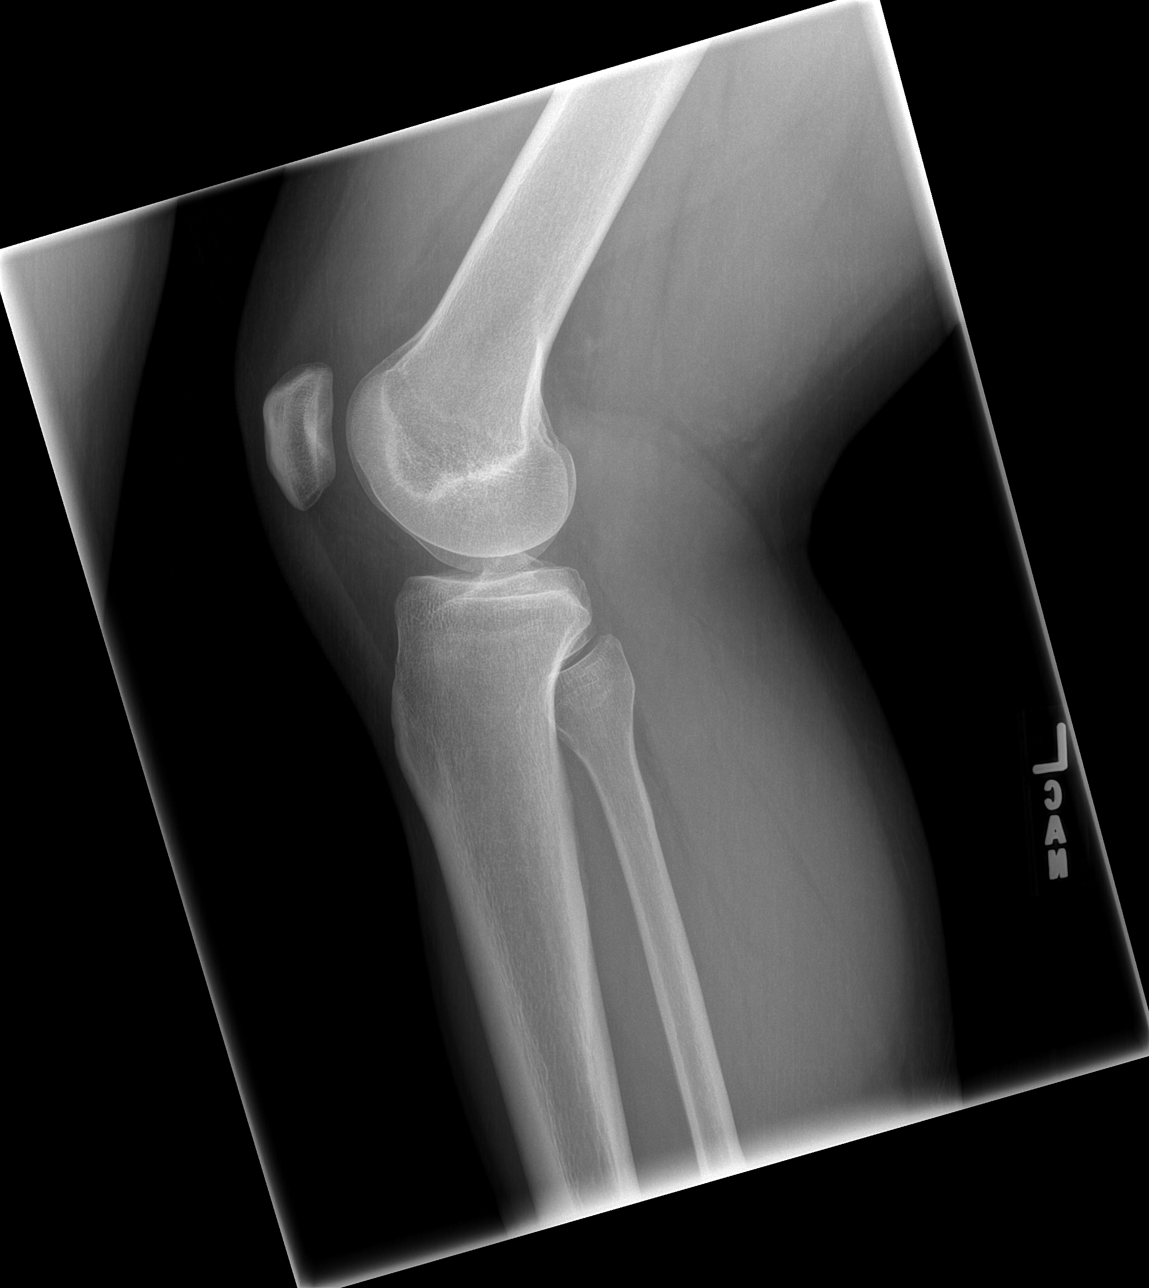

[4 of 4 positions shown; findings below may reference images not displayed]

FINDINGS: Small suprapatellar effusion. Minimal swelling. No acute fracture or
traumatic malalignment. Normal bone mineralization. No suspicious
osseous lesions. No significant arthrosis or joint space narrowing.
IMPRESSION: Small knee effusion and mild swelling. No acute osseous abnormality.

## 2020-10-10 IMAGING — RF DG ANKLE COMPLETE 3+V*L*
1 series · 3 of 3 positions shown · non-contrast
Comparison: Left ankle radiographs 04/23/2019.

FLUOROSCOPY TIME:  0 minutes 25 seconds.

CLINICAL DATA: 32-year-old male left ankle ORIF.

EXAM:
LEFT ANKLE COMPLETE - 3+ VIEW; DG C-ARM 1-60 MIN

[Series 1: run · 3 of 3 slices shown]
[im 1/3]
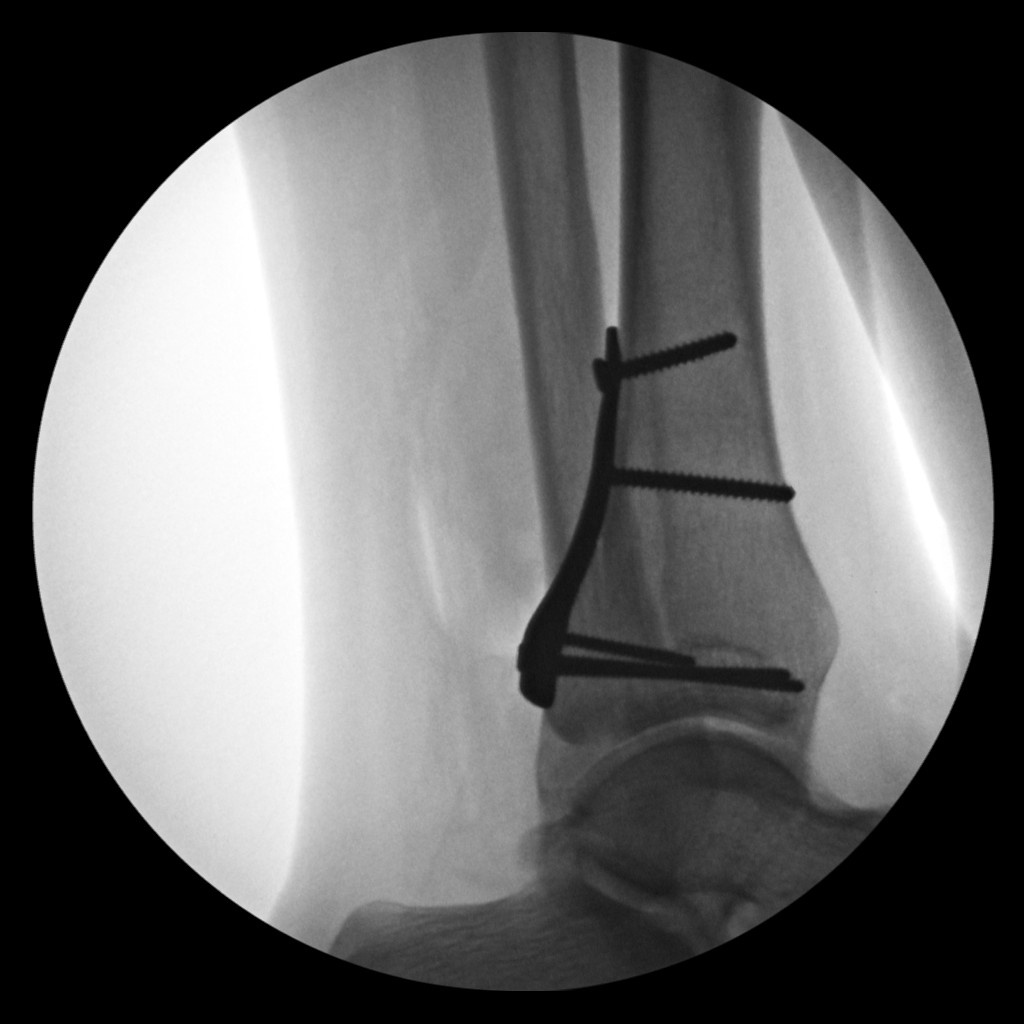
[im 2/3]
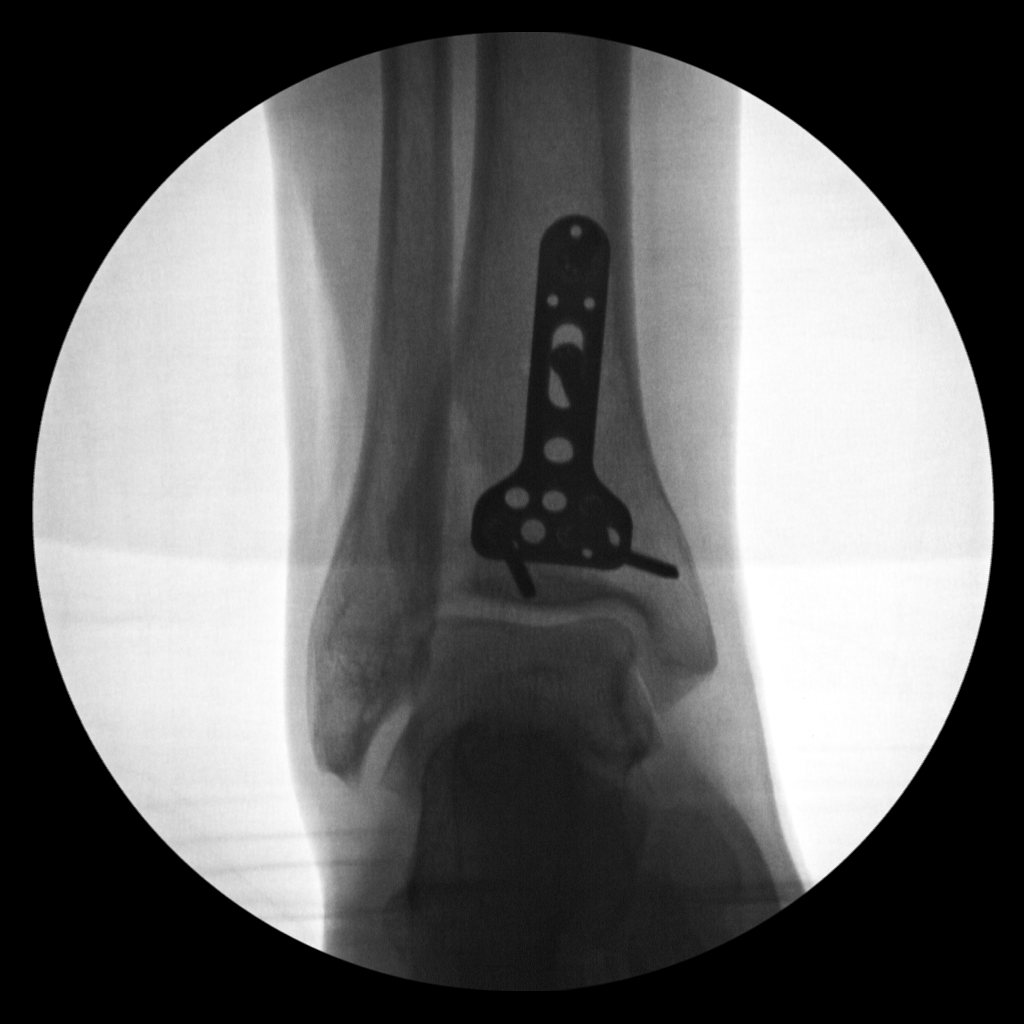
[im 3/3]
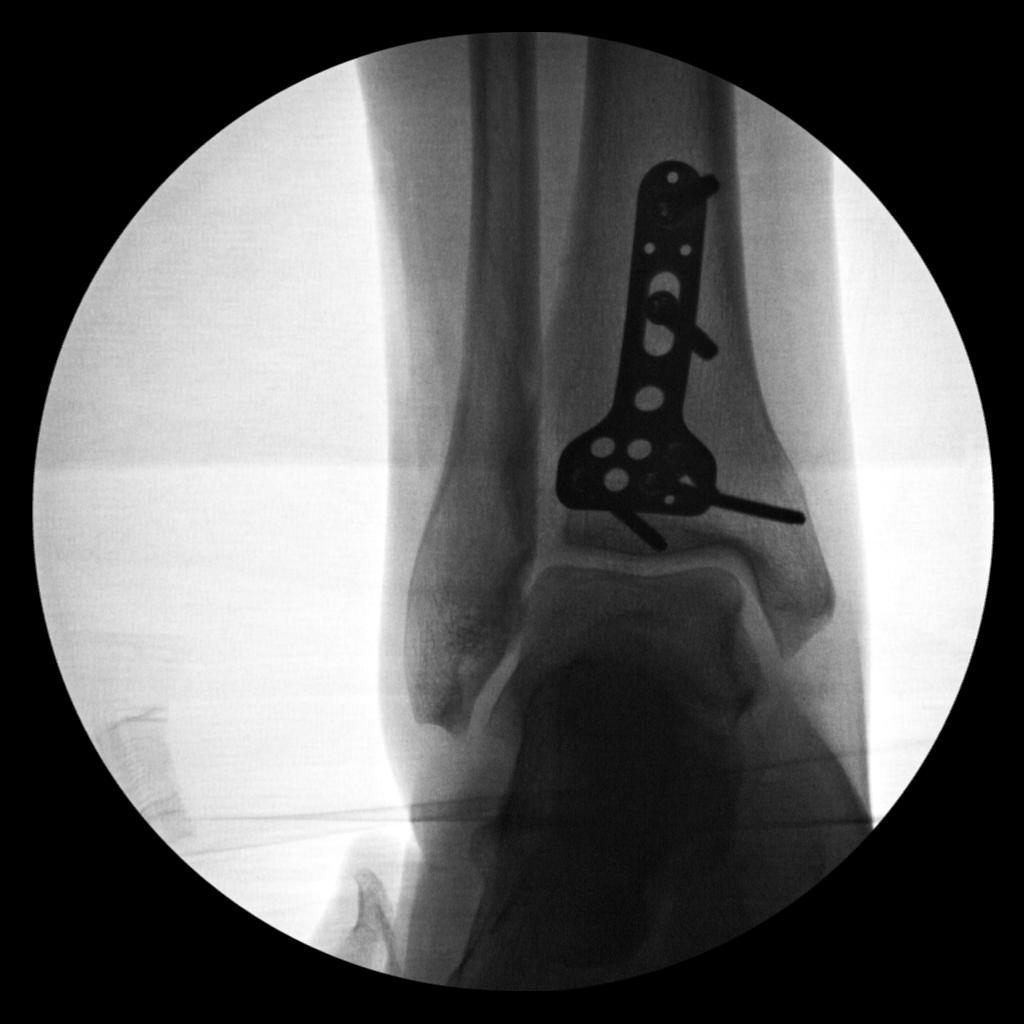

[3 of 3 positions shown; findings below may reference images not displayed]

FINDINGS: Three intraoperative fluoroscopic spot views of the left ankle.
Posterior malleolus fixation plate with screws depicted. Alignment
appears near anatomic. Hardware appears intact.
IMPRESSION: ORIF left posterior malleolus with no adverse features identified.
